# Patient Record
Sex: Female | Born: 2000
Health system: Southern US, Community
[De-identification: ages and names within clinical notes are randomized; demographics above are authoritative.]

## PROBLEM LIST (undated history)

## (undated) DIAGNOSIS — K8020 Calculus of gallbladder without cholecystitis without obstruction: Secondary | ICD-10-CM

## (undated) HISTORY — PX: CHOLECYSTECTOMY: SHX55

## (undated) HISTORY — DX: Calculus of gallbladder without cholecystitis without obstruction: K80.20

---
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
## 2018-01-14 ENCOUNTER — Encounter: Payer: Self-pay | Admitting: Family Medicine

## 2018-01-14 ENCOUNTER — Ambulatory Visit (INDEPENDENT_AMBULATORY_CARE_PROVIDER_SITE_OTHER): Payer: Managed Care, Other (non HMO) | Admitting: Family Medicine

## 2018-01-14 VITALS — BP 118/64 | HR 64 | Temp 98.4°F | Resp 16 | Ht 68.0 in | Wt 204.8 lb

## 2018-01-14 DIAGNOSIS — L03811 Cellulitis of head [any part, except face]: Secondary | ICD-10-CM

## 2018-01-14 MED ORDER — DOXYCYCLINE HYCLATE 100 MG PO CAPS: 100.0000 mg | ORAL_CAPSULE | Freq: Two times a day (BID) | ORAL | 0 refills | Status: DC

## 2018-01-14 MED FILL — DOXYCYCLINE HYCLATE 100 MG: 100 | 10 days supply | Qty: 20 | Fill #0

## 2018-01-14 NOTE — Patient Instructions (Signed)
We are going to treat you for the infection on your ear with doxycycline- take twice a day for 10 days Warm compresses may help if there is any more drainage  If this area is a recurrent issue you may want to have it removed by dermatology, but hopefully this will not be necessary Please let me know if not back to baseline in 3-4 days-Sooner if worse.

## 2018-01-14 NOTE — Progress Notes (Signed)
[facility] [address], [address] [place], [place] [address] [phone]  Date:  01/14/2018   Name:  Sherry Dominguez   DOB:  Oct 04, 2000   MRN:  [id number]  PCP:  Donato Schultz, DO    Chief Complaint: Infection on Ear (keloid scar, left ear, redness, pain to touch, discharge)   History of Present Illness:  Sherry Dominguez is a 17 y.o. very pleasant female patient who presents with the following:  Pt of Dr. Laury Axon here today with concern of a problem with her left ear She developed a keloid in her left pinna after a piercing in 2013.  It developed a keloid about 2 years after the piercing was placed, but otherwise did not hurt or cause any other issues.  She left the piercing in until a couple of months ago when she happened to remove it   Then about a week ago the keloid became tender, warm, and drained some pus and blood a couple of times No fever or chills No retained part of a piercing in keloild  She is otherwise feeling well Generally in good health   Patient Active Problem List   Diagnosis Date Noted  . Sinusitis 10/22/2011  . Serous otitis media 07/08/2011  . Viral pharyngitis 06/05/2011  . HYPERTRIGLYCERIDEMIA 02/19/2010  . EPISTAXIS 07/31/2009  . UNSPECIFIED ANEMIA 02/13/2009  . INGROWN TOENAIL 02/13/2009  . OTITIS EXTERNA, ACUTE, BILATERAL 10/06/2008  . GERD 10/06/2008  . PARONYCHIA, LEFT GREAT TOE 07/27/2008  . VIRAL URI 03/08/2008    No past medical history on file.  Past Surgical History:  Procedure Laterality Date  . CHOLECYSTECTOMY      Social History   Tobacco Use  . Smoking status: Never Smoker  . Smokeless tobacco: Never Used  Substance Use Topics  . Alcohol use: No  . Drug use: No    Family History  Problem Relation Age of Onset  . Hypertension Father   . Hyperlipidemia Father     Allergies  Allergen Reactions  . Sulfa Antibiotics Hives  . Clindamycin/Lincomycin Nausea  And Vomiting  . Amoxicillin Nausea And Vomiting  . Bactrim [Sulfamethoxazole-Trimethoprim]   . Penicillins Nausea And Vomiting  . Red Dye Nausea And Vomiting    Medication list has been reviewed and updated.  Current Outpatient Medications on File Prior to Visit  Medication Sig Dispense Refill  . MONONESSA 0.25-35 MG-MCG tablet Take 1 tablet by mouth daily.     No current facility-administered medications on file prior to visit.     Review of Systems:  As per HPI- otherwise negative. Pt states no chance of pregnancy at this time    Physical Examination: Vitals:   01/14/18 1323  BP: 118/64  Pulse: 64  Resp: 16  Temp: 98.4 F (36.9 C)  SpO2: 99%   Vitals:   01/14/18 1323  Weight: 204 lb 12.8 oz (92.9 kg)  Height: 5\' 8"  (1.727 m)   Body mass index is 31.14 kg/m. Ideal Body Weight: Weight in (lb) to have BMI = 25: 164.1  GEN: WDWN, NAD, Non-toxic, A & O x 3, overweight, looks well  HEENT: Atraumatic, Normocephalic. Neck supple. No masses, No LAD. Bilateral TM wnl, oropharynx normal.  PEERL,EOMI.   Left ear displays a blueberry sized keloid over the posterior pinna at the site of a former "barbell" type piercing The keloid is slightly erythematous and tender.  Cannot express any discharge or pus at this time.  Ears and Nose: No external deformity. CV: RRR, No M/G/R. No JVD. No thrill. No extra heart sounds. PULM: CTA B, no wheezes, crackles, rhonchi. No retractions. No resp. distress. No accessory muscle use. EXTR: No c/c/e NEURO Normal gait.  PSYCH: Normally interactive. Conversant. Not depressed or anxious appearing.  Calm demeanor.    Assessment and Plan: Cellulitis of head except face - Plan: doxycycline (VIBRAMYCIN) 100 MG capsule  Infected keloid on left ear Treat with doxycycline due to several drug allergies.  Cautioned that this med could decrease efficacy of her OCP   We are going to treat you for the infection on your ear with doxycycline- take twice  a day for 10 days Warm compresses may help if there is any more drainage  If this area is a recurrent issue you may want to have it removed by dermatology, but hopefully this will not be necessary Please let me know if not back to baseline in 3-4 days-Sooner if worse  Signed Abbe Amsterdam, MD

## 2018-08-13 DIAGNOSIS — Z01419 Encounter for gynecological examination (general) (routine) without abnormal findings: Secondary | ICD-10-CM | POA: Diagnosis not present

## 2018-08-13 DIAGNOSIS — Z6830 Body mass index (BMI) 30.0-30.9, adult: Secondary | ICD-10-CM | POA: Diagnosis not present

## 2019-01-30 ENCOUNTER — Encounter: Payer: Self-pay | Admitting: Emergency Medicine

## 2019-01-30 ENCOUNTER — Other Ambulatory Visit: Payer: Self-pay

## 2019-01-30 ENCOUNTER — Emergency Department (INDEPENDENT_AMBULATORY_CARE_PROVIDER_SITE_OTHER)
Admission: EM | Admit: 2019-01-30 | Discharge: 2019-01-30 | Disposition: A | Discharge status: Home / Self Care | Payer: Managed Care, Other (non HMO) | Source: Home / Self Care | Attending: Family Medicine | Admitting: Family Medicine

## 2019-01-30 DIAGNOSIS — J069 Acute upper respiratory infection, unspecified: Secondary | ICD-10-CM

## 2019-01-30 DIAGNOSIS — U071 COVID-19: Secondary | ICD-10-CM

## 2019-01-30 LAB — POC SARS CORONAVIRUS 2 AG -  ED: SARS Coronavirus 2 Ag: POSITIVE — AB

## 2019-01-30 NOTE — Discharge Instructions (Addendum)
Take plain guaifenesin (1227m extended release tabs such as Mucinex) twice daily, with plenty of water, for cough and congestion.  May add Pseudoephedrine (39m one or two every 4 to 6 hours) for sinus congestion.  Get adequate rest.   May use Afrin nasal spray (or generic oxymetazoline) each morning for about 5 days and then discontinue.  Also recommend using saline nasal spray several times daily and saline nasal irrigation (AYR is a common brand).  Use Flonase nasal spray each morning after using Afrin nasal spray and saline nasal irrigation. Try warm salt water gargles for sore throat.  Stop all antihistamines for now, and other non-prescription cough/cold preparations. May take Ibuprofen 20011m4 tabs every 8 hours with food for body aches, headache, etc. May take Delsym Cough Suppressant at bedtime for nighttime cough.    Isolate yourself until the below conditions are met: 1)  At least 7 days since symptoms onset. AND 2)  > 72 hours after symptom resolution (absence of fever without the use of fever-reducing medicine, and improvement in respiratory symptoms.

## 2019-01-30 NOTE — ED Provider Notes (Signed)
[name]    CSN: [id number] Arrival date & time: 01/30/19  1206      History   Chief Complaint Chief Complaint  Patient presents with  . Nasal Congestion  . Cough  . Ear Problem    HPI Sherry Dominguez is a 18 y.o. female.   Three days ago patient developed a mild sore throat, and yesterday developed mild cough, fatigue, and headache.  Her appetite has been decreased and today she has developed sweats.  She notes that she has a decreased sensation of taste.  She denies chest tightness or pleuritic pain.  The history is provided by the patient.    History reviewed. No pertinent past medical history.  Patient Active Problem List   Diagnosis Date Noted  . Sinusitis 10/22/2011  . Serous otitis media 07/08/2011  . Viral pharyngitis 06/05/2011  . HYPERTRIGLYCERIDEMIA 02/19/2010  . EPISTAXIS 07/31/2009  . UNSPECIFIED ANEMIA 02/13/2009  . INGROWN TOENAIL 02/13/2009  . OTITIS EXTERNA, ACUTE, BILATERAL 10/06/2008  . GERD 10/06/2008  . PARONYCHIA, LEFT GREAT TOE 07/27/2008  . VIRAL URI 03/08/2008    Past Surgical History:  Procedure Laterality Date  . CHOLECYSTECTOMY      OB History   No obstetric history on file.      Home Medications    Prior to Admission medications   Medication Sig Start Date End Date Taking? Authorizing Provider  norgestimate-ethinyl estradiol (ESTARYLLA) 0.25-35 MG-MCG tablet Take 1 tablet by mouth daily.   Yes [provider]  doxycycline (VIBRAMYCIN) 100 MG capsule Take 1 capsule (100 mg total) by mouth 2 (two) times daily. 01/14/18   Copland, Gay Filler, MD  MONONESSA 0.25-35 MG-MCG tablet Take 1 tablet by mouth daily. 04/29/15   [provider]    Family History Family History  Problem Relation Age of Onset  . Hypertension Father   . Hyperlipidemia Father     Social History Social History   Tobacco Use  . Smoking status: Former Research scientist (life sciences)  . Smokeless tobacco: Never Used  Substance Use Topics  .  Alcohol use: No  . Drug use: No     Allergies   Sulfa antibiotics, Clindamycin/lincomycin, Amoxicillin, Bactrim [sulfamethoxazole-trimethoprim], Penicillins, and Red dye   Review of Systems Review of Systems + sore throat + cough No pleuritic pain No wheezing No nasal congestion No post-nasal drainage No sinus pain/pressure No itchy/red eyes ? earache No hemoptysis No SOB No fever, + sweats No nausea No vomiting No abdominal pain No diarrhea No urinary symptoms No skin rash + fatigue No myalgias + headache    Physical Exam Triage Vital Signs ED Triage Vitals  Enc Vitals Group     BP 01/30/19 1353 120/79     Pulse Rate 01/30/19 1353 100     Resp 01/30/19 1353 16     Temp 01/30/19 1353 98.5 F (36.9 C)     Temp Source 01/30/19 1353 Oral     SpO2 01/30/19 1353 99 %     Weight 01/30/19 1355 194 lb (88 kg)     Height 01/30/19 1355 5' 8"  (1.727 m)     Head Circumference --      Peak Flow --      Pain Score 01/30/19 1354 0     Pain Loc --      Pain Edu? --      Excl. in [place]? --    No data found.  Updated Vital Signs BP 120/79 (BP Location: Right Arm)   Pulse 100  Temp 98.5 F (36.9 C) (Oral)   Resp 16   Ht 5' 8"  (1.727 m)   Wt 88 kg   LMP 01/18/2019 (Exact Date)   SpO2 99%   BMI 29.50 kg/m   Visual Acuity Right Eye Distance:   Left Eye Distance:   Bilateral Distance:    Right Eye Near:   Left Eye Near:    Bilateral Near:     Physical Exam Nursing notes and Vital Signs reviewed. Appearance:  Patient appears stated age, and in no acute distress Eyes:  Pupils are equal, round, and reactive to light and accomodation.  Extraocular movement is intact.  Conjunctivae are not inflamed  Ears:  Canals normal.  Tympanic membranes normal.  Nose:  Mildly congested turbinates.  No sinus tenderness.   Pharynx:  Normal Neck:  Supple.  Lateal nodes mildly enlarged, nontender Lungs:  Clear to auscultation.  Breath sounds are equal.  Moving air well. Heart:   Regular rate and rhythm without murmurs, rubs, or gallops.  Abdomen:  Nontender without masses or hepatosplenomegaly.  Bowel sounds are present.  No CVA or flank tenderness.  Extremities:  No edema.  Skin:  No rash present.    UC Treatments / Results  Labs (all labs ordered are listed, but only abnormal results are displayed) Labs Reviewed  POC SARS CORONAVIRUS 2 AG -  ED - Abnormal; Notable for the following components:      Result Value   SARS Coronavirus 2 Ag Positive (*)    All other components within normal limits    EKG   Radiology No results found.  Procedures Procedures (including critical care time)  Medications Ordered in UC Medications - No data to display  Initial Impression / Assessment and Plan / UC Course  I have reviewed the triage vital signs and the nursing notes.  Pertinent labs & imaging results that were available during my care of the patient were reviewed by me and considered in my medical decision making (see chart for details).    Treat symptomatically for now.  If symptoms become significantly worse during the night or over the weekend, proceed to the local emergency room.    Final Clinical Impressions(s) / UC Diagnoses   Final diagnoses:  Viral URI with cough  COVID-19 virus infection     Discharge Instructions     Take plain guaifenesin (1241m extended release tabs such as Mucinex) twice daily, with plenty of water, for cough and congestion.  May add Pseudoephedrine (334m one or two every 4 to 6 hours) for sinus congestion.  Get adequate rest.   May use Afrin nasal spray (or generic oxymetazoline) each morning for about 5 days and then discontinue.  Also recommend using saline nasal spray several times daily and saline nasal irrigation (AYR is a common brand).  Use Flonase nasal spray each morning after using Afrin nasal spray and saline nasal irrigation. Try warm salt water gargles for sore throat.  Stop all antihistamines for now, and  other non-prescription cough/cold preparations. May take Ibuprofen 20058m4 tabs every 8 hours with food for body aches, headache, etc. May take Delsym Cough Suppressant at bedtime for nighttime cough.    Isolate yourself until the below conditions are met: 1)  At least 7 days since symptoms onset. AND 2)  > 72 hours after symptom resolution (absence of fever without the use of fever-reducing medicine, and improvement in respiratory symptoms.        ED Prescriptions    None  Kandra Nicolas, MD 02/01/19 1251

## 2019-01-30 NOTE — ED Triage Notes (Signed)
Patient here for 2 day history of mild cough, ear discomfort/pressure; denies fever or other symptoms.  She has not had influenza vacc this season. She has not travelled or had contact with known covid positive person.

## 2019-03-26 ENCOUNTER — Other Ambulatory Visit: Payer: Self-pay

## 2019-03-26 ENCOUNTER — Encounter: Payer: Self-pay | Admitting: Family Medicine

## 2019-03-26 ENCOUNTER — Ambulatory Visit: Payer: BC Managed Care – PPO | Admitting: Family Medicine

## 2019-03-26 VITALS — BP 120/82 | Temp 97.5°F | Ht 68.0 in | Wt 203.0 lb

## 2019-03-26 DIAGNOSIS — H6591 Unspecified nonsuppurative otitis media, right ear: Secondary | ICD-10-CM | POA: Diagnosis not present

## 2019-03-26 DIAGNOSIS — H6983 Other specified disorders of Eustachian tube, bilateral: Secondary | ICD-10-CM

## 2019-03-26 MED ORDER — PREDNISONE 20 MG PO TABS: 40.0000 mg | ORAL_TABLET | Freq: Every day | ORAL | 0 refills | Status: AC

## 2019-03-26 MED ORDER — PREDNISONE 20 MG PO TABS: 40.0000 mg | ORAL_TABLET | Freq: Every day | ORAL | 0 refills | Status: DC

## 2019-03-26 NOTE — Patient Instructions (Signed)
Use Flonase in the future if this happens again.  If you start having increasing pain, worsening symptoms, fevers, or drainage from the ear, seek care.  Let us know if you need anything.

## 2019-03-26 NOTE — Progress Notes (Signed)
Chief Complaint  Patient presents with  . Ear Pain    both    Pt is here for bilateral ear pain. R is worse.  Duration: 2 weeks Progression: unchanged Associated symptoms: Fullness Denies: congestion, coryza and sneezing, bleeding, or discharge from ear Treatment to date: None  ROS:  HEENT: +ear pain Costitutional: Denies fevers  Family History  Problem Relation Age of Onset  . Hypertension Father   . Hyperlipidemia Father    BP 120/82 (BP Location: Left Arm, Patient Position: Sitting, Cuff Size: Normal)   Temp (!) 97.5 F (36.4 C) (Temporal)   Ht 5\' 8"  (1.727 m)   Wt 203 lb (92.1 kg)   BMI 30.87 kg/m  General: Awake, alert, appearing stated age HEENT:  L ear- Canal patent without drainage or erythema, TM is retracted w/o fluid or erythema R ear- canal patent without drainage or erythema, TM is mildly bulging w serous fluid, no erythema Nose- nares patent and without discharge Mouth- Lips, gums and dentition unremarkable, pharynx is without erythema or exudate Neck: No adenopathy Lungs: Normal effort, no accessory muscle use Psych: Age appropriate judgment and insight, normal mood and affect  Dysfunction of both eustachian tubes - Plan: predniSONE (DELTASONE) 20 MG tablet  Fluid level behind tympanic membrane of right ear - Plan: predniSONE (DELTASONE) 20 MG tablet  Pred burst. INCS if it happens in the future.  F/u in 2 weeks if symptoms fail to improve, sooner if needed. Pt voiced understanding and agreement to the plan.  [employer], DO 03/26/19 4:01 PM

## 2019-11-06 ENCOUNTER — Emergency Department (HOSPITAL_BASED_OUTPATIENT_CLINIC_OR_DEPARTMENT_OTHER)
Admission: EM | Admit: 2019-11-06 | Discharge: 2019-11-06 | Disposition: A | Discharge status: Home / Self Care | Payer: Managed Care, Other (non HMO) | Attending: Emergency Medicine | Admitting: Emergency Medicine

## 2019-11-06 ENCOUNTER — Other Ambulatory Visit: Payer: Self-pay

## 2019-11-06 ENCOUNTER — Encounter (HOSPITAL_BASED_OUTPATIENT_CLINIC_OR_DEPARTMENT_OTHER): Payer: Self-pay | Admitting: Emergency Medicine

## 2019-11-06 DIAGNOSIS — Y990 Civilian activity done for income or pay: Secondary | ICD-10-CM | POA: Insufficient documentation

## 2019-11-06 DIAGNOSIS — S060X0A Concussion without loss of consciousness, initial encounter: Secondary | ICD-10-CM | POA: Insufficient documentation

## 2019-11-06 DIAGNOSIS — Z87891 Personal history of nicotine dependence: Secondary | ICD-10-CM | POA: Insufficient documentation

## 2019-11-06 DIAGNOSIS — Y9389 Activity, other specified: Secondary | ICD-10-CM | POA: Diagnosis not present

## 2019-11-06 DIAGNOSIS — S0990XA Unspecified injury of head, initial encounter: Secondary | ICD-10-CM | POA: Insufficient documentation

## 2019-11-06 DIAGNOSIS — W208XXA Other cause of strike by thrown, projected or falling object, initial encounter: Secondary | ICD-10-CM | POA: Insufficient documentation

## 2019-11-06 DIAGNOSIS — Y92511 Restaurant or cafe as the place of occurrence of the external cause: Secondary | ICD-10-CM | POA: Insufficient documentation

## 2019-11-06 MED ORDER — ONDANSETRON 4 MG PO TBDP: 4.0000 mg | ORAL_TABLET | Freq: Three times a day (TID) | ORAL | 0 refills | Status: DC | PRN

## 2019-11-06 NOTE — Discharge Instructions (Signed)
You were evaluated in the Emergency Department and after careful evaluation, we did not find any emergent condition requiring admission or further testing in the hospital.  Your exam/testing today was overall reassuring.  Your symptoms seem to be due to a mild concussion.  As discussed, we recommend mental and physical rest for the next 2 days with slow return to normal daily activities.  You can use the Zofran medication as needed for nausea.  We recommend Tylenol or Motrin at home for discomfort.  Please return to the Emergency Department if you experience any worsening of your condition.  Thank you for allowing [place] to be a part of your care.

## 2019-11-06 NOTE — ED Triage Notes (Addendum)
Several boxes of frozen chicken fell on her head yesterday and knocked her over at work. C/o headache. Denies LOC

## 2019-11-06 NOTE — ED Provider Notes (Signed)
[facility] Emergency Department Provider Note MRN:  [id number]  Arrival date & time: 11/06/19     Chief Complaint   Head Injury   History of Present Illness   Sherry Dominguez is a 19 y.o. year-old female with no pertinent past medical history presenting to the ED with chief complaint of head injury.  Patient explains that she works at [employer] and was in the freezer and a box of frozen chicken fell off the rack onto her head.  Denies loss of consciousness, continued on with her shift yesterday.  Today has been experiencing some mild nausea, dull frontal headache, some lightheadedness when standing.  Here for evaluation.  Symptoms are mild to moderate, constant, no other exacerbating relieving factors.  Denies any numbness or weakness to the arms or legs, no fever, no neck pain, no other complaints.  Review of Systems  A complete 10 system review of systems was obtained and all systems are negative except as noted in the HPI and PMH.   Patient's Health History   History reviewed. No pertinent past medical history.  Past Surgical History:  Procedure Laterality Date  . CHOLECYSTECTOMY      Family History  Problem Relation Age of Onset  . Hypertension Father   . Hyperlipidemia Father     Social History   Socioeconomic History  . Marital status: Single    Spouse name: Not on file  . Number of children: Not on file  . Years of education: Not on file  . Highest education level: Not on file  Occupational History  . Not on file  Tobacco Use  . Smoking status: Former Games developer  . Smokeless tobacco: Never Used  Substance and Sexual Activity  . Alcohol use: No  . Drug use: No  . Sexual activity: Not on file  Other Topics Concern  . Not on file  Social History Narrative  . Not on file   Social Determinants of Health   Financial Resource Strain:   . Difficulty of Paying Living Expenses: Not on file  Food Insecurity:   . Worried About Community education officer in the Last Year: Not on file  . Ran Out of Food in the Last Year: Not on file  Transportation Needs:   . Lack of Transportation (Medical): Not on file  . Lack of Transportation (Non-Medical): Not on file  Physical Activity:   . Days of Exercise per Week: Not on file  . Minutes of Exercise per Session: Not on file  Stress:   . Feeling of Stress : Not on file  Social Connections:   . Frequency of Communication with Friends and Family: Not on file  . Frequency of Social Gatherings with Friends and Family: Not on file  . Attends Religious Services: Not on file  . Active Member of Clubs or Organizations: Not on file  . Attends Banker Meetings: Not on file  . Marital Status: Not on file  Intimate Partner Violence:   . Fear of Current or Ex-Partner: Not on file  . Emotionally Abused: Not on file  . Physically Abused: Not on file  . Sexually Abused: Not on file     Physical Exam   Vitals:   11/06/19 1902  BP: 128/75  Pulse: 72  Resp: 16  Temp: 98.2 F (36.8 C)  SpO2: 100%    CONSTITUTIONAL: Well-appearing, NAD NEURO:  Alert and oriented x 3, normal and symmetric strength and sensation, normal coordination, normal speech  EYES:  eyes equal and reactive ENT/NECK:  no LAD, no JVD CARDIO: Regular rate, well-perfused, normal S1 and S2 PULM:  CTAB no wheezing or rhonchi GI/GU:  normal bowel sounds, non-distended, non-tender MSK/SPINE:  No gross deformities, no edema SKIN:  no rash, atraumatic PSYCH:  Appropriate speech and behavior  *Additional and/or pertinent findings included in MDM below  Diagnostic and Interventional Summary    EKG Interpretation  Date/Time:    Ventricular Rate:    PR Interval:    QRS Duration:   QT Interval:    QTC Calculation:   R Axis:     Text Interpretation:        Labs Reviewed - No data to display  No orders to display    Medications - No data to display   Procedures  /  Critical Care Procedures  ED Course  and Medical Decision Making  I have reviewed the triage vital signs, the nursing notes, and pertinent available records from the EMR.  Listed above are laboratory and imaging tests that I personally ordered, reviewed, and interpreted and then considered in my medical decision making (see below for details).  Reassuring neurological exam, highly doubt intracranial bleeding or other emergent process.  Symptoms seem consistent with mild concussion.  Normal range of motion of the neck with no midline tenderness, no indication for any other imaging, appropriate for reassurance and mental/physical rest at home.       Elmer Sow. Pilar Plate, MD [facility] Emergency Medicine [facility] [email or web address]  Final Clinical Impressions(s) / ED Diagnoses     ICD-10-CM   1. Injury of head, initial encounter  S09.90XA   2. Concussion without loss of consciousness, initial encounter  S06.0X0A     ED Discharge Orders         Ordered    ondansetron (ZOFRAN ODT) 4 MG disintegrating tablet  Every 8 hours PRN        11/06/19 2143           Discharge Instructions Discussed with and Provided to Patient:     Discharge Instructions     You were evaluated in the Emergency Department and after careful evaluation, we did not find any emergent condition requiring admission or further testing in the hospital.  Your exam/testing today was overall reassuring.  Your symptoms seem to be due to a mild concussion.  As discussed, we recommend mental and physical rest for the next 2 days with slow return to normal daily activities.  You can use the Zofran medication as needed for nausea.  We recommend Tylenol or Motrin at home for discomfort.  Please return to the Emergency Department if you experience any worsening of your condition.  Thank you for allowing [place] to be a part of your care.       Sabas Sous, MD 11/06/19 2146

## 2019-11-08 ENCOUNTER — Ambulatory Visit (INDEPENDENT_AMBULATORY_CARE_PROVIDER_SITE_OTHER): Payer: Managed Care, Other (non HMO) | Admitting: Family

## 2019-11-08 ENCOUNTER — Encounter: Payer: Self-pay | Admitting: Family

## 2019-11-08 ENCOUNTER — Other Ambulatory Visit: Payer: Self-pay

## 2019-11-08 VITALS — BP 130/70 | HR 77 | Temp 98.6°F | Resp 16 | Ht 68.0 in | Wt 195.0 lb

## 2019-11-08 DIAGNOSIS — S060X0A Concussion without loss of consciousness, initial encounter: Secondary | ICD-10-CM | POA: Diagnosis not present

## 2019-11-08 NOTE — Patient Instructions (Signed)
You should be contacted about scheduling your appointment with the concussion clinic.  Concussion, Adult  A concussion is a brain injury from a hard, direct hit (trauma) to the head or body. This direct hit causes the brain to shake quickly back and forth inside the skull. This can damage brain cells and cause chemical changes in the brain. A concussion may also be known as a mild traumatic brain injury (TBI). Concussions are usually not life-threatening, but the effects of a concussion can be serious. If you have a concussion, you should be very careful to avoid having a second concussion. What are the causes? This condition is caused by:  A direct hit to your head, such as: ? Running into another player during a game. ? Being hit in a fight. ? Hitting your head on a hard surface.  Sudden movement of your body that causes your brain to move back and forth inside the skull, such as in a car crash. What are the signs or symptoms? The signs of a concussion can be hard to notice. Early on, they may be missed by you, family members, and health care providers. You may look fine on the outside but may act or feel differently. Symptoms are usually temporary and most often improve in 7-10 days. Some symptoms appear right away, but other symptoms may not show up for hours or days. If your symptoms last longer than normal, you may have post-concussion syndrome. Every head injury is different. Physical symptoms  Headaches. This can include a feeling of pressure in the head or migraine-like symptoms.  Tiredness (fatigue).  Dizziness.  Problems with coordination or balance.  Vision or hearing problems.  Sensitivity to light or noise.  Nausea or vomiting.  Changes in eating or sleeping patterns.  Numbness or tingling.  Seizure. Mental and emotional symptoms  Memory problems.  Trouble concentrating, organizing, or making decisions.  Slowness in thinking, acting or reacting, speaking, or  reading.  Irritability or mood changes.  Anxiety or depression. How is this diagnosed? This condition is diagnosed based on:  Your symptoms.  A description of your injury. You may also have tests, including:  Imaging tests, such as a CT scan or MRI.  Neuropsychological tests. These measure your thinking, understanding, learning, and remembering abilities. How is this treated? Treatment for this condition includes:  Stopping sports or activity if you are injured. If you hit your head or show signs of concussion: ? Do not return to sports or activities the same day. ? Get checked by a health care provider before you return to your activities.  Physical and mental rest and careful observation, usually at home. Gradually return to your normal activities.  Medicines to help with symptoms such as headaches, nausea, or difficulty sleeping. ? Avoid taking opioid pain medicine while recovering from a concussion.  Avoiding alcohol and drugs. These may slow your recovery and can put you at risk of further injury.  Referral to a concussion clinic or rehabilitation center. Recovery from a concussion can take time. How fast you recover depends on many factors. Return to activities only when:  Your symptoms are completely gone.  Your health care provider says that it is safe. Follow these instructions at home: Activity  Limit activities that require a lot of thought or concentration, such as: ? Doing homework or job-related work. ? Watching TV. ? Working on the computer or phone. ? Playing memory games and puzzles.  Rest. Rest helps your brain heal. Make sure you: ?  Get plenty of sleep. Most adults should get 7-9 hours of sleep each night. ? Rest during the day. Take naps or rest breaks when you feel tired.  Avoid physical activity like exercise until your health care provider says it is safe. Stop any activity that worsens symptoms.  Do not do high-risk activities that could  cause a second concussion, such as riding a bike or playing sports.  Ask your health care provider when you can return to your normal activities, such as school, work, athletics, and driving. Your ability to react may be slower after a brain injury. Never do these activities if you are dizzy. Your health care provider will likely give you a plan for gradually returning to activities. General instructions   Take over-the-counter and prescription medicines only as told by your health care provider. Some medicines, such as blood thinners (anticoagulants) and aspirin, may increase the risk for complications, such as bleeding.  Do not drink alcohol until your health care provider says you can.  Watch your symptoms and tell others around you to do the same. Complications sometimes occur after a concussion. Older adults with a brain injury may have a higher risk of serious complications.  Tell your work Production designer, theatre/television/film, teachers, Tax adviser, school counselor, coach, or Event organiser about your injury, symptoms, and restrictions.  Keep all follow-up visits as told by your health care provider. This is important. How is this prevented? Avoiding another brain injury is very important. In rare cases, another injury can lead to permanent brain damage, brain swelling, or death. The risk of this is greatest during the first 7-10 days after a head injury. Avoid injuries by:  Stopping activities that could lead to a second concussion, such as contact or recreational sports, until your health care provider says it is okay.  Taking these actions once you have returned to sports or activities: ? Avoiding plays or moves that can cause you to crash into another person. This is how most concussions occur. ? Following the rules and being respectful of other players. Do not engage in violent or illegal plays.  Getting regular exercise that includes strength and balance training.  Wearing a properly fitting helmet  during sports, biking, or other activities. Helmets can help protect you from serious skull and brain injuries, but they do not protect you from a concussion. Even when wearing a helmet, you should avoid being hit in the head. Contact a health care provider if:  Your symptoms get worse or they do not improve.  You have new symptoms.  You have another injury. Get help right away if:  You have severe or worsening headaches.  You have weakness or numbness in any part of your body.  You are confused.  Your coordination gets worse.  You vomit repeatedly.  You are sleepier than normal.  Your speech is slurred.  You cannot recognize people or places.  You have a seizure.  It is difficult to wake you up.  You have unusual behavior changes.  You have changes in your vision.  You lose consciousness. Summary  A concussion is a brain injury that results from a hard, direct hit (trauma) to your head or body.  You may have imaging tests and neuropsychological tests to diagnose a concussion.  Treatment for this condition includes physical and mental rest and careful observation.  Ask your health care provider when you can return to your normal activities, such as school, work, athletics, and driving.  Get help right away  if you have a severe headache, weakness on one side of the body, seizures, behavior changes, changes in vision, or if you are confused or sleepier than normal. This information is not intended to replace advice given to you by your health care provider. Make sure you discuss any questions you have with your health care provider. Document Revised: 10/16/2017 Document Reviewed: 10/16/2017 Elsevier Patient Education  2020 [employer].

## 2019-11-08 NOTE — Progress Notes (Addendum)
Subjective:    Patient ID: Sherry Dominguez, female    DOB: 09-Sep-2000, 19 y.o.   MRN: [id number]  HPI  Patient is a 19 yr old female who presents today following a head injury which occurred at work on 8/27. She reports that a box of frozen chicken fell on her head from while in the walk in freezer at her work. She reports that she was moving stacks of 40 pound frozen chicken and a box fell from high pushing her head into the door injuring her left jaw.  This occurred around 3 PM on 11/06/19.  She states that she took 5 minutes to compose herself and then finished the remaining 4 hours of her shift.  At 5PM that day she developed a HA.  The following day she returned to work around 2 PM.  Around 5:30 PM she reports that she developed some confusion. She was trying to read off words on the monitor at work but she states that the right words would not come out. She also had a brief episode of blurred vision.  Due to these symptoms she presented to the ED on 11/06/19. ED record is reviewed.   Since that time she has been having HA/sensitivity to light, some nausea.  She has not vomitted. She is eating and drinking.  HA is intermittent 5/10.  Tylenol and motrin does help her pain.    Review of Systems See HPI  No past medical history on file.   Social History   Socioeconomic History  . Marital status: Single    Spouse name: Not on file  . Number of children: Not on file  . Years of education: Not on file  . Highest education level: Not on file  Occupational History  . Not on file  Tobacco Use  . Smoking status: Former Games developer  . Smokeless tobacco: Never Used  Substance and Sexual Activity  . Alcohol use: No  . Drug use: No  . Sexual activity: Not on file  Other Topics Concern  . Not on file  Social History Narrative  . Not on file   Social Determinants of Health   Financial Resource Strain:   . Difficulty of Paying Living Expenses: Not on file  Food Insecurity:   . Worried  About Programme researcher, broadcasting/film/video in the Last Year: Not on file  . Ran Out of Food in the Last Year: Not on file  Transportation Needs:   . Lack of Transportation (Medical): Not on file  . Lack of Transportation (Non-Medical): Not on file  Physical Activity:   . Days of Exercise per Week: Not on file  . Minutes of Exercise per Session: Not on file  Stress:   . Feeling of Stress : Not on file  Social Connections:   . Frequency of Communication with Friends and Family: Not on file  . Frequency of Social Gatherings with Friends and Family: Not on file  . Attends Religious Services: Not on file  . Active Member of Clubs or Organizations: Not on file  . Attends Banker Meetings: Not on file  . Marital Status: Not on file  Intimate Partner Violence:   . Fear of Current or Ex-Partner: Not on file  . Emotionally Abused: Not on file  . Physically Abused: Not on file  . Sexually Abused: Not on file    Past Surgical History:  Procedure Laterality Date  . CHOLECYSTECTOMY      Family History  Problem Relation Age  of Onset  . Hypertension Father   . Hyperlipidemia Father     Allergies  Allergen Reactions  . Sulfa Antibiotics Hives  . Clindamycin/Lincomycin Nausea And Vomiting  . Amoxicillin Nausea And Vomiting  . Bactrim [Sulfamethoxazole-Trimethoprim]   . Penicillins Nausea And Vomiting  . Red Dye Nausea And Vomiting    Current Outpatient Medications on File Prior to Visit  Medication Sig Dispense Refill  . norgestimate-ethinyl estradiol (ESTARYLLA) 0.25-35 MG-MCG tablet Take 1 tablet by mouth daily.    . ondansetron (ZOFRAN ODT) 4 MG disintegrating tablet Take 1 tablet (4 mg total) by mouth every 8 (eight) hours as needed for nausea or vomiting. 20 tablet 0   No current facility-administered medications on file prior to visit.    BP 130/70 (BP Location: Right Arm, Patient Position: Sitting, Cuff Size: Small)   Pulse 77   Temp 98.6 F (37 C) (Oral)   Resp 16   Ht 5'  8" (1.727 m)   Wt 195 lb (88.5 kg)   LMP 10/12/2019   SpO2 100%   BMI 29.65 kg/m       Objective:   Physical Exam Constitutional:      Appearance: She is well-developed.  HENT:     Head: Normocephalic and atraumatic.     Comments: Some tenderness overlying left lateral jaw Eyes:     Extraocular Movements:     Right eye: Normal extraocular motion.     Pupils: Pupils are equal, round, and reactive to light.  Cardiovascular:     Rate and Rhythm: Normal rate and regular rhythm.     Heart sounds: Normal heart sounds. No murmur heard.   Pulmonary:     Effort: Pulmonary effort is normal. No respiratory distress.     Breath sounds: Normal breath sounds. No wheezing.  Neurological:     Mental Status: She is alert and oriented to person, place, and time.     Cranial Nerves: Facial asymmetry present. No cranial nerve deficit or dysarthria.     Motor: No weakness.     Deep Tendon Reflexes: Reflexes normal.  Psychiatric:        Behavior: Behavior normal.        Thought Content: Thought content normal.        Judgment: Judgment normal.           Assessment & Plan:  Post concussive syndrome- reassuring neuro exam.  Will refer to concussion clinic.    20 minutes spent on today's visit. Time was spent reviewing chart, interviewing/examining patient and forming medical plan.   This visit occurred during the SARS-CoV-2 public health emergency.  Safety protocols were in place, including screening questions prior to the visit, additional usage of staff PPE, and extensive cleaning of exam room while observing appropriate contact time as indicated for disinfecting solutions.

## 2019-11-09 NOTE — Addendum Note (Signed)
Addended by: Sandford Craze on: 11/09/2019 09:46 AM   Modules accepted: Level of Service

## 2019-11-10 ENCOUNTER — Telehealth: Payer: Self-pay | Admitting: Family Medicine

## 2019-11-10 NOTE — Telephone Encounter (Signed)
Called pt and she will need to call back to schedule a new pt concussion visit.  Please schedule in any appropriate 30 min slot between 8am-3:30pm.

## 2019-11-10 NOTE — Telephone Encounter (Signed)
Referred for concussion from Sandford Craze, for your review.

## 2020-03-30 ENCOUNTER — Ambulatory Visit: Payer: Managed Care, Other (non HMO) | Admitting: Family Medicine

## 2020-04-17 ENCOUNTER — Ambulatory Visit: Payer: BC Managed Care – PPO | Admitting: Family Medicine

## 2020-04-17 ENCOUNTER — Encounter: Payer: Self-pay | Admitting: Family Medicine

## 2020-04-17 ENCOUNTER — Other Ambulatory Visit: Payer: Self-pay

## 2020-04-17 VITALS — BP 120/60 | HR 90 | Temp 98.5°F | Resp 18 | Ht 68.0 in | Wt 216.4 lb

## 2020-04-17 DIAGNOSIS — G44301 Post-traumatic headache, unspecified, intractable: Secondary | ICD-10-CM | POA: Diagnosis not present

## 2020-04-17 DIAGNOSIS — S0990XA Unspecified injury of head, initial encounter: Secondary | ICD-10-CM | POA: Diagnosis not present

## 2020-04-17 DIAGNOSIS — R413 Other amnesia: Secondary | ICD-10-CM | POA: Diagnosis not present

## 2020-04-17 LAB — CBC WITH DIFFERENTIAL/PLATELET
Basophils Absolute: 0 10*3/uL (ref 0.0–0.1)
Basophils Relative: 0.4 % (ref 0.0–3.0)
Eosinophils Absolute: 0 10*3/uL (ref 0.0–0.7)
Eosinophils Relative: 0.6 % (ref 0.0–5.0)
HCT: 36.9 % (ref 36.0–46.0)
Hemoglobin: 12.3 g/dL (ref 12.0–15.0)
Lymphocytes Relative: 36.1 % (ref 12.0–46.0)
Lymphs Abs: 2 10*3/uL (ref 0.7–4.0)
MCHC: 33.4 g/dL (ref 30.0–36.0)
MCV: 88.5 fl (ref 78.0–100.0)
Monocytes Absolute: 0.4 10*3/uL (ref 0.1–1.0)
Monocytes Relative: 8 % (ref 3.0–12.0)
Neutro Abs: 3 10*3/uL (ref 1.4–7.7)
Neutrophils Relative %: 54.9 % (ref 43.0–77.0)
Platelets: 257 10*3/uL (ref 150.0–400.0)
RBC: 4.17 Mil/uL (ref 3.87–5.11)
RDW: 12.9 % (ref 11.5–15.5)
WBC: 5.6 10*3/uL (ref 4.0–10.5)

## 2020-04-17 LAB — COMPREHENSIVE METABOLIC PANEL
ALT: 10 U/L (ref 0–35)
AST: 12 U/L (ref 0–37)
Albumin: 4.2 g/dL (ref 3.5–5.2)
Alkaline Phosphatase: 49 U/L (ref 39–117)
BUN: 13 mg/dL (ref 6–23)
CO2: 27 mEq/L (ref 19–32)
Calcium: 9.8 mg/dL (ref 8.4–10.5)
Chloride: 104 mEq/L (ref 96–112)
Creatinine, Ser: 0.76 mg/dL (ref 0.40–1.20)
GFR: 112.04 mL/min (ref >60.00)
Glucose, Bld: 76 mg/dL (ref 70–99)
Potassium: 4.6 mEq/L (ref 3.5–5.1)
Sodium: 136 mEq/L (ref 135–145)
Total Bilirubin: 0.4 mg/dL (ref 0.2–1.2)
Total Protein: 7.1 g/dL (ref 6.0–8.3)

## 2020-04-17 LAB — SEDIMENTATION RATE: Sed Rate: 11 mm/hr (ref 0–20)

## 2020-04-17 NOTE — Assessment & Plan Note (Signed)
Probably post concussive Will refer to neuro  Check labs

## 2020-04-17 NOTE — Patient Instructions (Signed)
[employer] [facility] (pp. 2876-2897). [place], [place]. Elsevier."> Neurosurgery, 80(1), 6-15. Retrieved on August 30, 2018.[email or web address]"> Primary Care (5th ed., pp. 218-221). [place], [place]: Elsevier."> Rosen's Emergency Medicine: Concepts and Clinical Practice (9th ed., pp. 301-329). [place], [place]: Elsevier."> Neurosurgery, 75 Suppl 1, S3-15. Retrieved on August 30, 2018.[email or web address]">  Head Injury, Adult There are many types of head injuries. Head injuries can be as minor as a small bump, or they can be a serious medical issue. More severe head injuries include:  A jarring injury to the brain (concussion).  A bruise (contusion) of the brain. This means there is bleeding in the brain that can cause swelling.  A cracked skull (skull fracture).  Bleeding in the brain that collects, clots, and forms a bump (hematoma). After a head injury, most problems occur within the first 24 hours, but side effects may occur up to 7-10 days after the injury. It is important to watch your condition for any changes. You may need to be observed in the emergency department or urgent care, or you may be admitted to the hospital. What are the causes? There are many possible causes of a head injury. Serious head injuries may be caused by car accidents, bicycle or motorcycle accidents, sports injuries, falls, or being struck by an object. What are the symptoms? Symptoms of a head injury include a contusion, bump, or bleeding at the site of the injury. Other physical symptoms may include:  Headache.  Nausea or vomiting.  Dizziness.  Blurred or double vision.  Being uncomfortable around bright lights or loud noises.  Seizures.  Feeling tired.  Trouble being awakened.  Loss of consciousness. Mental or emotional symptoms may include:  Irritability.  Confusion and memory problems.  Poor attention and concentration.  Changes  in eating or sleeping habits.  Anxiety or depression. How is this diagnosed? This condition can usually be diagnosed based on your symptoms, a description of the injury, and a physical exam. You may also have imaging tests done, such as a CT scan or an MRI. How is this treated? Treatment for this condition depends on the severity and type of injury you have. The main goal of treatment is to prevent complications and allow the brain time to heal. Mild head injury If you have a mild head injury, you may be sent home, and treatment may include:  Observation. A responsible adult should stay with you for 24 hours after your injury and check on you often.  Physical rest.  Brain rest.  Pain medicines. Severe head injury If you have a severe head injury, treatment may include:  Close observation. This includes hospitalization with the following care: ? Frequent physical exams. ? Frequent checks of how your brain and nervous system are working (neurological status). ? Checking your blood pressure and oxygen levels.  Medicines to relieve pain, prevent seizures, and decrease brain swelling.  Airway protection and breathing support. This may include using a ventilator.  Treatments that monitor and manage swelling inside the brain.  Brain surgery. This may be needed to: ? Remove a collection of blood or blood clots. ? Stop the bleeding. ? Remove a part of the skull to allow room for the brain to swell. Follow these instructions at home: Activity  Rest and avoid activities that are physically hard or tiring.  Make sure you get enough sleep.  Let your brain rest by limiting activities that require a lot of thought or attention, such as: ? Watching TV. ? Playing memory games   and puzzles. ? Job-related work or homework. ? Working on the computer, using social media, and texting.  Avoid activities that could cause another head injury, such as playing sports, until your health care  provider approves. Having another head injury, especially before the first one has healed, can be dangerous.  Ask your health care provider when it is safe for you to return to your regular activities, including work or school. Ask your health care provider for a step-by-step plan for gradually returning to activities.  Ask your health care provider when you can drive, ride a bicycle, or use heavy machinery. Your ability to react may be slower after a brain injury. Do not do these activities if you are dizzy. Lifestyle  Do not drink alcohol until your health care provider approves. Do not use drugs. Alcohol and certain drugs may slow your recovery and can put you at risk of further injury.  If it is harder than usual to remember things, write them down.  If you are easily distracted, try to do one thing at a time.  Talk with family members or close friends when making important decisions.  Tell your friends, family, a trusted colleague, and work manager about your injury, symptoms, and restrictions. Have them watch for any new or worsening problems.   General instructions  Take over-the-counter and prescription medicines only as told by your health care provider.  Have someone stay with you for 24 hours after your head injury. This person should watch you for any changes in your symptoms and be ready to seek medical help.  Keep all follow-up visits as told by your health care provider. This is important. How is this prevented?  Work on improving your balance and strength to avoid falls.  Wear a seat belt when you are in a moving vehicle.  Wear a helmet when riding a bicycle, skiing, or doing any other sport or activity that has a risk of injury.  If you drink alcohol: ? Limit how much you use to:  0-1 drink a day for nonpregnant women.  0-2 drinks a day for men. ? Be aware of how much alcohol is in your drink. In the [place] one drink equals one 12 oz bottle of beer (355 mL), one 5  oz glass of wine (148 mL), or one 1 oz glass of hard liquor (44 mL).  Take safety measures in your home, such as: ? Removing clutter and tripping hazards from floors and stairways. ? Using grab bars in bathrooms and handrails by stairs. ? Placing non-slip mats on floors and in bathtubs. ? Improving lighting in dim areas. Where to find more information  Centers for Disease Control and Prevention: www.cdc.gov Get help right away if:  You have: ? A severe headache that is not helped by medicine. ? Trouble walking or weakness in your arms and legs. ? Clear or bloody fluid coming from your nose or ears. ? Changes in your vision. ? A seizure. ? Increased confusion or irritability.  Your symptoms get worse.  You are sleepier than normal and have trouble staying awake.  You lose your balance.  Your pupils change size.  Your speech is slurred.  Your dizziness gets worse.  You vomit. These symptoms may represent a serious problem that is an emergency. Do not wait to see if the symptoms will go away. Get medical help right away. Call your local emergency services (911 in the [place] Do not drive yourself to the hospital. Summary  Head   injuries can be minor, or they can be a serious medical issue requiring immediate attention.  Treatment for this condition depends on the severity and type of injury you have.  Have someone stay with you for 24 hours after your injury and check on you often.  Ask your health care provider when it is safe for you to return to your regular activities, including work or school.  Head injury prevention includes wearing a seat belt in a motor vehicle, using a helmet on a bicycle, limiting alcohol use, and taking safety measures in your home. This information is not intended to replace advice given to you by your health care provider. Make sure you discuss any questions you have with your health care provider. Document Revised: 01/08/2019 Document Reviewed:  01/08/2019 Elsevier Patient Education  2021 [employer]  

## 2020-04-17 NOTE — Progress Notes (Signed)
Patient ID: Sherry Dominguez, female    DOB: [birth year]  Age: 20 y.o. MRN: [id number]    Subjective:  Subjective  HPI [name] presents for f/u concussion in September --- she went to er --- frozen chickens fell on her and she hit her head on a metal wall.  She worked for several more hours   She started getting headaches that are severe and last all day sometimes.   Tylenol rarely helps no vision changes ,  No headache today but she usually gets them several days a weeks -- they will come on as they day goes on and sometimes las 1-2 days.   + photo/ phono phonbia  + nausea   Review of Systems  Constitutional: Negative for appetite change, diaphoresis, fatigue and unexpected weight change.  Eyes: Negative for pain, redness and visual disturbance.  Respiratory: Negative for cough, chest tightness, shortness of breath and wheezing.   Cardiovascular: Negative for chest pain, palpitations and leg swelling.  Gastrointestinal: Positive for nausea. Negative for vomiting.  Endocrine: Negative for cold intolerance, heat intolerance, polydipsia, polyphagia and polyuria.  Genitourinary: Negative for difficulty urinating, dysuria and frequency.  Neurological: Positive for headaches. Negative for dizziness, light-headedness and numbness.  Psychiatric/Behavioral: Positive for confusion and decreased concentration. Negative for self-injury, sleep disturbance and suicidal ideas. The patient is not nervous/anxious and is not hyperactive.     History No past medical history on file.  She has a past surgical history that includes Cholecystectomy.   Her family history includes Hyperlipidemia in her father; Hypertension in her father.She reports that she has quit smoking. She has never used smokeless tobacco. She reports that she does not drink alcohol and does not use drugs.  Current Outpatient Medications on File Prior to Visit  Medication Sig Dispense Refill  . norgestimate-ethinyl estradiol  (ORTHO-CYCLEN) 0.25-35 MG-MCG tablet Take 1 tablet by mouth daily.    . ondansetron (ZOFRAN ODT) 4 MG disintegrating tablet Take 1 tablet (4 mg total) by mouth every 8 (eight) hours as needed for nausea or vomiting. 20 tablet 0   No current facility-administered medications on file prior to visit.     Objective:  Objective  Physical Exam Vitals and nursing note reviewed.  Constitutional:      Appearance: She is well-developed and well-nourished.  HENT:     Head: Normocephalic and atraumatic.  Eyes:     General: No visual field deficit.    Extraocular Movements: EOM normal.     Conjunctiva/sclera: Conjunctivae normal.  Neck:     Thyroid: No thyromegaly.     Vascular: No carotid bruit or JVD.  Cardiovascular:     Rate and Rhythm: Normal rate and regular rhythm.     Heart sounds: Normal heart sounds. No murmur heard.   Pulmonary:     Effort: Pulmonary effort is normal. No respiratory distress.     Breath sounds: Normal breath sounds. No wheezing or rales.  Chest:     Chest wall: No tenderness.  Musculoskeletal:        General: No edema.     Cervical back: Normal range of motion and neck supple.  Neurological:     Mental Status: She is alert.     Sensory: No sensory deficit.     Motor: No weakness.     Coordination: Coordination normal.     Gait: Gait normal.     Comments: mmse 27/30  Psychiatric:        Mood and Affect: Mood and affect and mood  normal.        Behavior: Behavior normal.    BP 120/60 (BP Location: Right Arm, Patient Position: Sitting, Cuff Size: Normal)   Pulse 90   Temp 98.5 F (36.9 C) (Oral)   Resp 18   Ht 5\' 8"  (1.727 m)   Wt 216 lb 6.4 oz (98.2 kg)   SpO2 98%   BMI 32.90 kg/m  Wt Readings from Last 3 Encounters:  04/17/20 216 lb 6.4 oz (98.2 kg)  11/08/19 195 lb (88.5 kg)  11/06/19 192 lb (87.1 kg)     Lab Results  Component Value Date   WBC 7.8 07/03/2015   HGB 13.4 07/03/2015   HCT 39.8 07/03/2015   PLT 351.0 07/03/2015   GLUCOSE  88 07/24/2015   CHOL 176 02/13/2010   TRIG 258.0 (H) 02/13/2010   HDL 32.60 (L) 02/13/2010   LDLDIRECT 112.0 02/13/2010   ALT 9 07/24/2015   AST 11 07/24/2015   NA 137 07/24/2015   K 4.0 07/24/2015   CL 104 07/24/2015   CREATININE 0.72 07/24/2015   BUN 12 07/24/2015   CO2 27 07/24/2015    No results found.   Assessment & Plan:  Plan  I am having Vanetta Rommel maintain her norgestimate-ethinyl estradiol and ondansetron.  No orders of the defined types were placed in this encounter.   Problem List Items Addressed This Visit      Unprioritized   Head trauma - Primary    Check MRI brain Refer to neuro       Relevant Orders   MR Brain Wo Contrast   CBC with Differential/Platelet   Comprehensive metabolic panel   Sedimentation rate   Ambulatory referral to Neurology   Intractable post-traumatic headache    Take tylenol at first sign headache If no relief rto or go to er  Check mri brain       Memory loss    Probably post concussive Will refer to neuro  Check labs       Relevant Orders   Ambulatory referral to Neurology      Follow-up: Return if symptoms worsen or fail to improve.  07/26/2015, DO

## 2020-04-17 NOTE — Assessment & Plan Note (Signed)
Check MRI brain Refer to neuro

## 2020-04-17 NOTE — Assessment & Plan Note (Signed)
Take tylenol at first sign headache If no relief rto or go to er  Check mri brain

## 2020-04-24 ENCOUNTER — Ambulatory Visit (HOSPITAL_COMMUNITY)
Admission: RE | Admit: 2020-04-24 | Discharge: 2020-04-24 | Disposition: A | Discharge status: Home / Self Care | Payer: No Typology Code available for payment source | Source: Ambulatory Visit | Attending: Family Medicine | Admitting: Family Medicine

## 2020-04-24 DIAGNOSIS — S0990XA Unspecified injury of head, initial encounter: Secondary | ICD-10-CM | POA: Insufficient documentation

## 2020-05-02 ENCOUNTER — Encounter: Payer: Self-pay | Admitting: Counselor

## 2020-06-15 ENCOUNTER — Encounter: Payer: Self-pay | Admitting: Counselor

## 2020-06-15 ENCOUNTER — Ambulatory Visit (INDEPENDENT_AMBULATORY_CARE_PROVIDER_SITE_OTHER): Payer: BC Managed Care – PPO | Admitting: Counselor

## 2020-06-15 ENCOUNTER — Other Ambulatory Visit: Payer: Self-pay

## 2020-06-15 ENCOUNTER — Ambulatory Visit: Payer: BC Managed Care – PPO

## 2020-06-15 DIAGNOSIS — F0781 Postconcussional syndrome: Secondary | ICD-10-CM

## 2020-06-15 DIAGNOSIS — F329 Major depressive disorder, single episode, unspecified: Secondary | ICD-10-CM

## 2020-06-15 DIAGNOSIS — R454 Irritability and anger: Secondary | ICD-10-CM | POA: Diagnosis not present

## 2020-06-15 DIAGNOSIS — F32A Depression, unspecified: Secondary | ICD-10-CM

## 2020-06-15 DIAGNOSIS — F09 Unspecified mental disorder due to known physiological condition: Secondary | ICD-10-CM

## 2020-06-15 NOTE — Progress Notes (Signed)
   Psychometrist Note   Cognitive testing was administered to [employer] by Lamar Benes, B.S. (Technician) under the supervision of Alphonzo Severance, Psy.D., ABN. [name] was able to tolerate all test procedures. Dr. Nicole Kindred met with the patient as needed to manage any emotional reactions to the testing procedures. Rest breaks were offered.    The battery of tests administered was selected by Dr. Nicole Kindred with consideration to the patient's current level of functioning, the nature of her symptoms, emotional and behavioral responses during the interview, level of literacy, observed level of motivation/effort, and the nature of the referral question. This battery was communicated to the psychometrist. Communication between Dr. Nicole Kindred and the psychometrist was ongoing throughout the evaluation and Dr. Nicole Kindred was immediately accessible at all times. Dr. Nicole Kindred provided supervision to the technician on the date of this service, to the extent necessary to assure the quality of all services provided.    Ms. Sconyers will return in approximately one week for an interactive feedback session with Dr. Nicole Kindred, at which time test performance, clinical impressions, and treatment recommendations will be reviewed in detail. The patient understands she can contact our office should she require our assistance before this time.   A total of 140 minutes of billable time were spent with Enid Skeens by the technician, including test administration and scoring time. Billing for these services is reflected in Dr. Les Pou note.   This note reflects time spent with the psychometrician and does not include test scores, clinical history, or any interpretations made by Dr. Nicole Kindred. The full report will follow in a separate note.

## 2020-06-15 NOTE — Progress Notes (Signed)
NEUROPSYCHOLOGICAL TEST SCORES [facility]  Patient Name: Sherry Dominguez MRN: [id number] Date of Birth: Jan 13, 2001 Age: 20 y.o. Education: 13 years  Measurement properties of test scores: IQ, Index, and Standard Scores (SS): Mean = 100; Standard Deviation = 15 Scaled Scores (Ss): Mean = 10; Standard Deviation = 3 Z scores (Z): Mean = 0; Standard Deviation = 1 T scores (T); Mean = 50; Standard Deviation = 10  TEST SCORES:    Note: This summary of test scores accompanies the interpretive report and should not be interpreted by unqualified individuals or in isolation without reference to the report. Test scores are relative to age, gender, and educational history as available and appropriate.   Performance Validity        ACS: Raw  Descriptor      Word Choice 44 Below Expectation       Raw  Descriptor  The Dot Counting Test: 6 Within Expectation      Embedded Measures: Raw  Descriptor      NAB Effort Index 0 Within Expectation      Expected Functioning        Wide Range Achievement Test: Standard/Scaled Score Percentile      Word Reading 99 47      Reynolds Intellectual Screening Test Standard/T-score Percentile      Guess What 46 34      Odd Item Out 50 50  RIST Index 97 42      Attention/Processing Speed        Neuropsychological Assessment Battery (Attention Module, Form 1): Scaled/T-score Percentile  Attention Index (ATT): 110 75      Digits Forward 43 25      Digits Backwards 54 66      Dots 49 46      Numbers & Letters A Speed 65 93      Numbers & Letters A Accuracy 31 3      Numbers & Letters A Efficiency 65 93      Numbers & Letters B Efficiency 65 93      Numbers & Letters C Efficiency 55 69      Numbers & Letters D Disruption 69 97      Numbers & Letters D Efficiency 69 97      Driving Scenes 33 5      Language        Neuropsychological Assessment Battery (Language Module, Form 1): T-score Percentile      Naming   (31) 59 82      Verbal  Fluency:  T Score Percentile      Controlled Oral Word Association (F-A-S) 36 8      Semantic Fluency (Animals) 43 25      Memory:        Neuropsychological Assessment Battery (Memory Module, Form 1): T-score/Standard Score Percentile  Memory Index (MEM): 96 39      List Learning           List A Immediate Recall   (6 , 10 , 12) 48 42         List B Immediate Recall   (5) 44 27         List A Short Delayed Recall   (9) 46 34         List A Long Delayed Recall   (11) 60 84         List A Percent Retention   (122 %) --- 88  List A Long Delayed Yes/No Recognition Hits   (12) --- 75         List A Long Delayed Yes/No Recognition False Alarms   (1) --- 58         List A Recognition Discriminability Index --- 66      Shape Learning           Immediate Recognition   (6 , 4 , 7) 41 18         Delayed Recognition   (9) 57 75         Percent Retention   (129 %) --- 88         Delayed Forced-Choice Recognition Hits   (7) --- 25         Delayed Forced-Choice Recognition False Alarms   (1) --- <1         Delayed Forced-Choice Recognition Discriminability --- 9      Story Learning           Immediate Recall   (28, 34) 42 21         Delayed Recall   (32) 41 18         Percent Retention   (94 %) --- 46      Daily Living Memory            Immediate Recall   (24, 23) 50 50          Delayed Recall   (9, 8) 52 58          Percent Retention (100 %) --- 69          Recognition Hits   (10) --- 73      Visuospatial/Constructional Functioning        Neuropsychological Assessment Battery (Visuospatial Module) T-score Percentile      Visual Discrimination 40 16      Design Construction 70 98      Executive Functioning        Modified Wisconsin Card Sorting Test (MWCST): Standard/T-Score Percentile      Number of Categories Correct 54 66      Number of Perseverative Errors 56 73      Number of Total Errors 45 31      Percent Perseverative Errors 56 73  Executive Function Composite 108 70       Trail Making Test: T-Score Percentile      Part A 69 97      Part B 80 >99      Rating Scales         Raw Score Descriptor  Beck Depression Inventory - II 38 Severe  Beck Anxiety Inventory 13 Mild   Austine Wiedeman V. Roseanne Reno PsyD, ABN Clinical Neuropsychologist

## 2020-06-15 NOTE — Progress Notes (Signed)
[facility]  Dominguez Name: Sherry Dominguez MRN: [id number] Date of Birth: 2000/08/01 Age: 20 y.o. Education: 61 years  Referral Circumstances and Background Information  Sherry Dominguez is a 20 y.o., right-hand dominant, single woman with a history of head injury referred by her PCP Dr. Carollee Herter for neuropsychological evaluation. Since Sherry incident on 11/05/2019, she has had recurrent intractable posttraumatic headache.   As per available records, Sherry Dominguez was in her normal state of health on 11/05/2019 and was completing a shift at worked when she was struck by a 40lb box of frozen chicken falling off a shelf, pushing her head into Sherry door and injuring her left jaw. She did not lose consciousness and it sounds as though she has detailed recollection of Sherry events immediately preceding and following Sherry incident. She took several minutes to compose herself and was able to finish her shift but did develop a headache after about 2 hours. Sherry following day she felt as though "Sherry right words would not come out" when she was reading Sherry monitor at work and she also had a brief episode of blurred vision, prompting her to go to Sherry ED. She was evaluated without being admitted or imaged based on her symptoms, with impression of concussion. She has subsequently developed headaches several days per week that come and go and sometimes last 1-2 days associated with photoboia, phonophobia, and nausea.   Dominguez mother reported working a night shift and so she wasn't particularly involved with Sherry Dominguez until November and noticed changes in that time.   On interview, Sherry Dominguez reported that she noticed difficulties with memory and thinking starting around December, 2021 nearly 4 months after her head injury. It sounds as though these difficulties were also noticed by her mother, who has experience with head injuries by virtue of a family member and was quite  concerned. She initially didn't make much of it but eventually, in January, 2022, she decided to get her difficulties checked out, resulting in her visit with Dr. Carollee Herter, Sherry MRI, and Sherry current referral. In terms of day-to-day symptoms, she feels like she will forget details of conversations, sometimes entire conversations, and she will also have a hard time recalling everything that she needs to do at work. She feels like these issues come and go to some extent but are "more frequent than not." Her mother Sherry Dominguez is here today and has noticed those difficulties, she will tell her things and Sherry Dominguez will swear that she has never told her. She will also occasionally lose her train of thought. On specific review of symptoms, Sherry Dominguez reported that she was having problems with word finding and putting words together after Sherry incident but not so much anymore. She acknowledged feeling slower than in Sherry past, as though she has difficulties keeping up with things, which she notices mainly at work. She acknowledged problems with attention and concentration, such as when in a conversation, although she feels like she has always been that way. She thinks this is because she is always doing multiple things at once. She has no difficulties with judgment and problem solving or decision making.   With respect to mood, Sherry Dominguez reported that she "stresses out" about things more than she should lately. She feels irritable, more than she should be, and that has been going on for a while and has been noticeable to her mother. She endorses a subjective sense of sadness and not getting as  much pleasure out of life as she would like, which has been going on years. Apart from her mother, she stated that his has probably been an issue since late elementary school. Things were difficult at home, her mother was in "survival mode," and she had to do a lot of self--parenting. She is not sleeping well, she wakes up at 3:30am  every night and can't go back to bed. Sometimes, she is thinking about things but other times, she just gets up. Her energy is variable although she is quite active, she apparently works 60 hours a week, is taking courses towards her associate's, and she is in Sherry gym 4 days a week. She would like to be a Airline pilot and wants to get stronger so she is involved in power lifting. It sounds like her appetite is variable, she has a hard time having consistent meals due to her schedule and at times, she isn't hungry. She usually will eat 2 meals a day, but sometimes she cannot eat related to her work schedule and odd hours.   Sherry Dominguez became tearful when I asked if she has problems day-to-day as a result of her memory and thinking problems. Apparently, her manager is quite hard on her, and will get on her case and be "fairly aggressive" if she forgets to do anything. 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 They have also lost many people over Sherry past several weeks and are short staffed, which makes it hard. She denied that she is having any difficulties functioning at school as a result of these problems, her grades have not gone down, and she has a cumulative GPA around 3.8.  Sherry Dominguez reported that she lives on her own and she is independent with respect to all things such as managing money, making appointments, medications, and Sherry like. She does write a lot of notes.   Past Medical History and Review of Relevant Studies   Dominguez Active Problem List   Diagnosis Date Noted  . Head trauma 04/17/2020  . Memory loss 04/17/2020  . Intractable post-traumatic headache 04/17/2020  . Sinusitis 10/22/2011  . Serous otitis media 07/08/2011  . Viral pharyngitis 06/05/2011  . HYPERTRIGLYCERIDEMIA 02/19/2010  . EPISTAXIS 07/31/2009  . UNSPECIFIED ANEMIA 02/13/2009  . INGROWN TOENAIL 02/13/2009  .  OTITIS EXTERNA, ACUTE, BILATERAL 10/06/2008  . GERD 10/06/2008  . PARONYCHIA, LEFT GREAT TOE 07/27/2008  . VIRAL URI 03/08/2008    Review of Neuroimaging and Relevant Medical History: Recent MRI brain from 04/25/2020 was reviewed by me and is normal without any areas of concerning volume loss, leukoaraiosis, or blooming artifacts on GRE. Reassuring MRI of Sherry brain.   Sherry Dominguez denied any other pertinent neurological history such as seizures, strokes, or other head injuries.   Current Outpatient Medications  Medication Sig Dispense Refill  . norgestimate-ethinyl estradiol (ORTHO-CYCLEN) 0.25-35 MG-MCG tablet Take 1 tablet by mouth daily.    . ondansetron (ZOFRAN ODT) 4 MG disintegrating tablet Take 1 tablet (4 mg total) by mouth every 8 (eight) hours as needed for nausea or vomiting. 20 tablet 0   No current facility-administered medications for this visit.   Family History  Problem Relation Age of Onset  . Hypertension Father   . Hyperlipidemia Father    There is a family history of dementia. Sherry Dominguez's great grandmother developed Sherry condition later in life. There is no  family history of psychiatric illness. Sherry Dominguez has a half-brother with ADHD (father's side).   Psychosocial History  Developmental, Educational and Employment History: Sherry Dominguez had a normal childhood development and on time attainment of milestones. It sounds as though her father was difficult to be around and unstable. Her parents divorced when she was 69, which was hard on her. Her older brother thus took on a lot of Sherry parenting responsibilities and was not Sherry best parental figure as per Sherry Dominguez. Nevertheless, there was no frank abuse or neglect. 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 She said she let it slip because she was "done with high school." She was able to to graduate a semester early. There is no  history of concerns about ADHD, behavior problems, or Sherry like. Her mother had experience with ADHD from a son in law and stated that she "never saw that" in Sherry Dominguez. She reported that she never thought she wanted to go to college. At Sherry time, she wanted to join Sherry [employer], although she was not eligible related to a knee condition. She eventually decided that she wanted to be a IT trainer and is currently en route to that goal. She is currently working on a degree in Nurse, adult services at [employer].   Psychiatric History: Sherry Dominguez reported that she did some counseling her Freshman year of high school for "a couple months." This was when her father got remarried. She didn't find it particularly helpful and stopped. She has no psychiatric history. She has a history of "minor self injury" in middle school.   Substance Use History: Sherry Dominguez doesn't use any illicit substances, she doesn't drink regularly, she does vape most days. She had quit and restarted in February.   Relationship History and Living Cimcumstances: Sherry Dominguez was in a long-term relationship that ended in February, 2022. She had met this individual online although did meet him several times. It sounds like that was stressful.   Mental Status and Behavioral Observations  Sensorium/Arousal: Sherry Dominguez's level of arousal was awake and alert. Hearing and vision were adequate for testing purposes. Orientation: Sherry Dominguez was alert and oriented to person, place, time, and situation.  Appearance: Dressed in appropriate, casual clothing with good grooming and hygiene.  Behavior: Pleasant, appropriate, quite tearful during interview. As per technician, Dominguez was well engaged, and in fact said that she enjoyed testing.  Speech/language: Sherry Dominguez's speech was normal in rate, rhythm, volume, and prosody without word finding difficulties or paraphasic errors.  Gait/Posture: Normal on observation of ambulation within Sherry  clinic Movement: No signs of movement problems in this young Dominguez Social Comportment: Appropriate within social norms Mood: Depressed Affect: Mood congruent with frequent tearfulness Thought process/content: Sherry Dominguez's thought process was logical, linear, and goal directed. She was able to readily provide a clinical history and detailed personal timeline.  Safety: Sherry Dominguez does have some passive thoughts of dying but she has no intent of carrying them out. These are ongoing and have been an issue for some time.  Insight: Fair  Test Procedures  [facility] Achievement Test - 4             Word Reading [employer] Intellectual Screening Test Neuropsychological Assessment Battery             Attention Module  Memory Module              Publishing copy Sherry Dot Counting Test ACS Word Choice Controlled Oral Word Association (F-A-S) Conservation officer, historic buildings (Animals) Trail Making Test A & B Modified Wisconsin Card Sorting Test [place] Depression Inventory - II Beck Anxiety Inventory  Plan  Sherry Dominguez was seen for a psychiatric diagnostic evaluation and neuropsychological testing. She is a pleasant, 20 year old, right-hand dominant woman with a history of mild head injury resulting in a possible concussion on 11/05/2019. She did not lose consciousness but did have posttraumatic headache and other symptoms. Several months later, Sherry Dominguez and her mother because concerned about cognitive changes and then eventually in January, 2022 they decided to pursue further workup. On interview, she presents as very reasonable although she does appear to have some affective issues and was frequently tearful. She also has headaches that are recurrent. She is functioning at a very high level, enrolled in full time courses and working 60 hours a week. Full and complete note with impressions, recommendations, and  interpretation of test data to follow.   Sherry Dominguez Sherry Kindred, PsyD, [place] Clinical Neuropsychologist  Informed Consent  Risks and benefits of Sherry evaluation were discussed with Sherry Dominguez prior to all testing procedures. I conducted a clinical interview   with Sherry Dominguez and Lamar Benes, B.S. (Technician) administered additional test procedures. Sherry Dominguez was able to tolerate Sherry testing procedures and Sherry Dominguez (and/or family if applicable) is likely to benefit from further follow up to receive Sherry diagnosis and treatment recommendations, which will be rendered at Sherry next encounter.

## 2020-06-19 NOTE — Progress Notes (Signed)
NEUROPSYCHOLOGICAL EVALUATION [facility]  Patient Name: Sherry Dominguez MRN: [id number] Date of Birth: Jul 07, 2000 Age: 20 y.o. Education: 13 years  Clinical Impressions  Sherry Dominguez is a 21 y.o., right-hand dominant, single woman with a history of mild head injury on August 27th, 2021 when she was struck by a 40lb box of frozen chicken as she was rearranging things in the freezer at work. She did not lose consciousness and she has detailed recollection for the events surrounding the incident although she did develop a headache and has recurrent posttraumatic headaches since then. She and her mother noticed memory and thinking problems about 3-4 months later and decided to get them checked out. The patient mostly notices her symptoms at work, and coworkers have commented that her memory is "not like it was."  She also forgets details of things, can forget entire conversations, she feels slower than in the past. She does acknowledge that she multitasks and is "always doing something else" when studying, having a conversation, etc. 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   Findings from neuropsychological testing are reassuring and demonstrate entirely normal performance in all areas. Specifically, Sherry Dominguez performed at an average level on measures of attention and memory (in aggregate) and there were no patterns of low scores concerning for cognitive impairment. She did report severe levels of depressive symptoms and was quite tearful at times. She reported moderate levels of anxiety symptoms.   Sherry Dominguez is thus demonstrating good cognitive performance following what sounds like a mild head injury. My sense is that her day-to-day problems are more likely on the basis of affective distress and she likely has a frank major depressive disorder. She is also managing an  incredibly demanding schedule (working 60 hours a week while taking full time classes and hitting the gym 4x a week), which would be objectively challenging for anyone. Sleep difficulties may also be contributory. She will be reassured regarding her cognition and directed to appropriate treatment, which could include medication at the referring provider's discretion.   Diagnostic Impressions: Depressive disorder Insomnia  Recommendations to be discussed with patient  Your performance and presentation on neuropsychological assessment were entirely normal. That is to say, you performed within the expected range for you in all domains assessed. I would like to highlight that you actually did quite well, with superior scores on several challenging measures of processing speed (which is one of the main deficits we expect to find in individuals with head injury). You also had very good, near errorless delayed word list recall that is better than 84% of age and education matched peers. This is all very reassuring regarding your objective level of cognitive performance. Nevertheless, you are having difficulties day-to-day and I think there are several potential contributing factors, although they are not likely due to your head injury.   When an individual strikes their head and experiences an alteration of consciousness or a brief loss of consciousness without complications like a brain bleed, this is called a concussion or "mild traumatic brain injury." This is not a brain injury per se in terms of causing structural damage to the brain, but it does negatively impact the brain's functioning in terms of energy metabolism (i.e, it is a metabolic injury). Most people experience cognitive symptoms following a concussion, and these typically resolve within hours to days. The vast majority of individuals  recover completely in a matter of time, without any measurable lasting cognitive impairments. A small minority of  people may experience persisting symptoms, the so-called "postconcussion syndrome," and there is debate about why this is the case. Oftentimes, the symptoms that people report are things that happen with a relatively high frequency in the general population (e.g., things like irritability, difficulties concentrating, and headaches). These things are also associated with depression and anxiety, which are two of the biggest risk factors risk factors for postconcussion syndrome. Things like changes in routines, pain related to headaches, difficulties readjusting to work and normal activities, financial stress, depression, and anxiety about full recovery may be more important precipitants of postconcussive symptoms than the brain injury itself. My best recommendation is to resume normal levels of activity as tolerated after you are medically cleared to do so and not to overly focus on cognitive problems, which will only make them worse by distracting you and detracting attention from the task at hand.    It is also important to know that cognitive difficulties due to a head injury do not worsen over time, as a rule, unless this is over the course of hours to days immediately after the injury related to bleeding or other serious problems. The fact that you noticed your problems months after your injury is further reassuring that the problems you are having are not due to a brain injury.   You reported a severe level of depressive symptoms. Depression can affect cognitive functioning in several ways. For one, there are neurobiological changes in depression that can contribute to attention, concentration, and memory problems. These changes are so common they are part of the criteria we use to diagnose depressive disorders. Depression can also negatively impact your own appraisal of your cognitive abilities, leading you to feel like you are performing more poorly than is objectively warranted.  The treatments of  choice for depression are medication and/or psychotherapy. There is some evidence that patients who choose their preferred method of treatment do better than those who are assigned to treatment arbitrarily, so I would encourage you to feel empowered to get better in the way that makes most sense to you.   Excessive life stress is a risk factor for cognitive difficulties, sleep problems, depression, and other stress-related health outcomes. Your schedule is objectively very challenging and would be a lot for anyone to manage. I would encourage you to consider if there are any things that you may wish to rearrange or scale back. Sometimes, problems sleeping, depression, and memory concerns are a sign that you are taking on too many things. I would also like to highlight that this is a personal decision, at the end of the day, and is also something you could discuss with a psychotherapist in terms of finding balance in your life.   Avoid overfocusing on cognitive performance. Memory and cognition are notoriously fallible and if you are looking for cognitive problems, you are bound to find them. Once someone gets worried about their memory and thinking, they may overfocus on how they are doing day-to-day, and then when normal day-to-day cognitive errors are made, this becomes a cause for more concern. This concern and anxiety then decreases focus from the task at hand, reducing concentration, causing more cognitive problems, and creating a vicious cycle. Rather than critiquing your performance, I would encourage you to remain present minded and focus on the task at hand. Perhaps most importantly, have reasonable expectations for yourself.  Healthy people forget things,  lose focus, and do not perform 100% correctly all the time. Some cognitive errors are normal and are not necessarily a sign that there is something wrong with your brain.   There are few things as disruptive to brain functioning as not getting a  good night's sleep. For sleep, I recommend against using medications, which can have lingering sedating effects on the brain and rob your brain of restful REM sleep. Instead, consider trying some of the following sleep hygiene recommendations. They may not work at once and may take effort, but the effort you spend is likely to be rewarded with better sleep eventually:  . Stick to a sleep schedule of the same bedtime and wake time even on the weekends, which can help to regulate your body's internal clock so that you fall asleep and stay asleep.  . Practice a relaxing bedtime ritual (conducted away from bright lights) which will help separate your sleep from stimulating activities and prepare your body to fall asleep when you go to bed.  . Avoid naps, especially in the afternoon.  . Evaluate your room and create conditions that will promote sleep such as keeping it cool (between 60 - 67 degrees), quiet, and free from any lights. Consider using blackout curtains, a "white noise" generator, or fan that will help mask any noises that might prevent you from going to sleep or awaken you during the night.  . Sleep on a comfortable mattress and pillows.  . Avoid bright light in the evening and excessive use of portable electronic devices right before bed that may contain light frequencies that can contribute to sleep problems.  . Avoid alcohol, cigarettes, or heavy meals in the evening. If you must eat, consume a light snack 45 minutes before bed.  . Use your bed only for sleep to strengthen the association between your bed and sleep.  . If you can't go to sleep within 30 minutes, go into another room and do something relaxing until you feel tired. Then, come back and try to go to sleep again for 30 minutes and repeat until sleep is achieved.  . Some people find over the counter melatonin to be helpful for sleep, which you could discuss with a pharmacist or prescribing provider.   You are already exercising,  which is great. Establishing the habit of exercise now will hold you in good stead as you age, it is one of the most powerful preventative medicines that we have and improves outcomes in a huge number of different health outcomes (cognitive, cerebrovascular, cardiovascular, psychiatric, metabolic, etc.).   Test Findings  Test scores are summarized in additional documentation associated with this encounter. Test scores are relative to age, gender, and educational history as available and appropriate. There were no concerns about performance validity as all findings fell within normal expectations.   General Intellectual Functioning/Achievement:  Performance on single word reading was average and performance on the RIST index was average with comparable average range scores on the verbally and visually oriented subtests. This suggests average as a reasonable standard of comparison for the patient's cognitive test performance.   Attention and Processing Efficiency: Indicators of attention and processing efficiency were high average overall despite some minor intrasubtest variability that does not appear to be in a clinically meaningful pattern and the number of low subtest scores is not abnormal. Performance on measures of digit repetition was low average for digits forward and average for digits backward. Identifying changes to a series of printed stimulus materials was  average for dot patterns and unusually low for driving scenes.   With respect to timed measures of processing speed, there was some indication of speed accuracy trade off in that the patient achieved very good scores for speed but then somewhat lower scores for accuracy. Nevertheless, performance was in the superior range on three such measures involving letter cancellation, letter counting, and alternating attention between mental calculation and letter cancellation. She performed at an average level on a similar task involving mental  addition under time pressure (patient said she was never strong with math).   Language: Performance was normal within this domain with errorless visual object confrontation naming. Generation of words was unusually low in response to the letters F-A-S but she scored in the low average range in response to the category prompt "animals."   Visuospatial Function: Performance on indicators of visuospatial and constructional abilities was within normal limits, with very superior performance on a constructional task involving assembling two-dimensional target designs from a series of small plastic tile pieces. Find visual discrimination and attention to detail was low average.   Learning and Memory: Performance on measures of learning and memory fell at a reasonable average level overall and there are no low subtest scores. She demonstrated good acquisition and retention of information across time.   In there verbal realm, Ms. Ahart demonstrated average immediate recall for a 12-item word list with 6, 10, and 12 words across three learning trials. Short delayed recall was average and long delayed recall was excellent, in the high average range. Recognition discriminability for the words versus false choices was average. Her memory for other material including stories and brief daily-living type information was low average to average on immediate recall with commensurate delayed recall performance and good retention of information over time. Recognition of the daily living information was average.   In the visual realm, she performed at a low average level when learning a series of designs that are difficult to verbally encode. Her delayed recognition for the designs was high average. Recognition discriminability using a yes/no format was a bit lower, in the low average to unusually low range, although that is unconcerning in the context of delayed free recall performance.   Executive  Functions: Performance was good on executive indicators with an average score on the [employer] Composite of a card sorting measure involving cognitive flexibility and concept formation/problem solving. Alternating sequencing of numbers and letters of the alphabet was truly outstanding and fell at a superior level, > 99% of her age and education matched cohort. Generation of words in response to the letters F-A-S was high average.   Rating Scale(s): Ms. Rotert screened in the severe range for depressive symptoms and in the mild range for anxiety symptoms on self-rating measures of those constructs. Qualitatively, many of her responses on the depression measures seemed to involve feelings of low self-worth and inadequacy that might be effective targets for psychotherapeutic intervention. She did endorse having thoughts of harming herself but that she would not carry them out, which was queried with her. She has had those thoughts for a long time but has no intention of acting on them and identified the relationships she has with people who love her as a protective factor.   Bettye Boeck Roseanne Reno, PsyD, ABN Clinical Neuropsychologist  Coding and Compliance  Billing below reflects technician time, my direct face-to-face time with the patient, time spent in test administration, and time spent in professional activities including but not limited to: neuropsychological test  interpretation, integration of neuropsychological test data with clinical history, report preparation, treatment planning, care coordination, and review of diagnostically pertinent medical history or studies.   Services associated with this encounter: Clinical Interview [phone]) plus 160 minutes ([id number]; Neuropsychological Evaluation by Professional)  140 minutes ([id number]; Neuropsychological Testing by Technician)

## 2020-06-22 ENCOUNTER — Encounter: Payer: Self-pay | Admitting: Counselor

## 2020-06-22 ENCOUNTER — Other Ambulatory Visit: Payer: Self-pay

## 2020-06-22 ENCOUNTER — Ambulatory Visit (INDEPENDENT_AMBULATORY_CARE_PROVIDER_SITE_OTHER): Payer: BC Managed Care – PPO | Admitting: Counselor

## 2020-06-22 DIAGNOSIS — G4700 Insomnia, unspecified: Secondary | ICD-10-CM

## 2020-06-22 DIAGNOSIS — F32A Depression, unspecified: Secondary | ICD-10-CM | POA: Insufficient documentation

## 2020-06-22 DIAGNOSIS — F329 Major depressive disorder, single episode, unspecified: Secondary | ICD-10-CM

## 2020-06-22 NOTE — Patient Instructions (Signed)
Your performance and presentation on neuropsychological assessment were entirely normal. That is to say, you performed within the expected range for you in all domains assessed. I would like to highlight that you actually did quite well, with superior scores on several challenging measures of processing speed (which is one of the main deficits we expect to find in individuals with head injury). You also had very good, near errorless delayed word list recall that is better than 84% of age and education matched peers. This is all very reassuring regarding your objective level of cognitive performance. Nevertheless, you are having difficulties day-to-day and I think there are several potential contributing factors, although they are not likely due to your head injury.   When an individual strikes their head and experiences an alteration of consciousness or a brief loss of consciousness without complications like a brain bleed, this is called a concussion or "mild traumatic brain injury." This is not a brain injury per se in terms of causing structural damage to the brain, but it does negatively impact the brain's functioning in terms of energy metabolism (i.e, it is a metabolic injury). Most people experience cognitive symptoms following a concussion, and these typically resolve within hours to days. The vast majority of individuals recover completely in a matter of time, without any measurable lasting cognitive impairments. A small minority of people may experience persisting symptoms, the so-called "postconcussion syndrome," and there is debate about why this is the case. Oftentimes, the symptoms that people report are things that happen with a relatively high frequency in the general population (e.g., things like irritability, difficulties concentrating, and headaches). These things are also associated with depression and anxiety, which are two of the biggest risk factors risk factors for postconcussion syndrome.  Things like changes in routines, pain related to headaches, difficulties readjusting to work and normal activities, financial stress, depression, and anxiety about full recovery may be more important precipitants of postconcussive symptoms than the brain injury itself. My best recommendation is to resume normal levels of activity as tolerated after you are medically cleared to do so and not to overly focus on cognitive problems, which will only make them worse by distracting you and detracting attention from the task at hand.    It is also important to know that cognitive difficulties due to a head injury do not worsen over time, as a rule, unless this is over the course of hours to days immediately after the injury related to bleeding or other serious problems. The fact that you noticed your problems months after your injury is further reassuring that the problems you are having are not due to a brain injury.   You reported a severe level of depressive symptoms. Depression can affect cognitive functioning in several ways. For one, there are neurobiological changes in depression that can contribute to attention, concentration, and memory problems. These changes are so common they are part of the criteria we use to diagnose depressive disorders. Depression can also negatively impact your own appraisal of your cognitive abilities, leading you to feel like you are performing more poorly than is objectively warranted.  The treatments of choice for depression are medication and/or psychotherapy. There is some evidence that patients who choose their preferred method of treatment do better than those who are assigned to treatment arbitrarily, so I would encourage you to feel empowered to get better in the way that makes most sense to you. We discussed this and you will reach out for a referral if  you need one. You could also consult www.psychologytoday.com or Dr. Zola Button regarding a referral.   Excessive  life stress is a risk factor for cognitive difficulties, sleep problems, depression, and other stress-related health outcomes. Your schedule is objectively very challenging and would be a lot for anyone to manage. I would encourage you to consider if there are any things that you may wish to rearrange or scale back. Sometimes, problems sleeping, depression, and memory concerns are a sign that you are taking on too many things. I would also like to highlight that this is a personal decision, at the end of the day, and is also something you could discuss with a psychotherapist in terms of finding balance in your life.   Avoid overfocusing on cognitive performance. Memory and cognition are notoriously fallible and if you are looking for cognitive problems, you are bound to find them. Once someone gets worried about their memory and thinking, they may overfocus on how they are doing day-to-day, and then when normal day-to-day cognitive errors are made, this becomes a cause for more concern. This concern and anxiety then decreases focus from the task at hand, reducing concentration, causing more cognitive problems, and creating a vicious cycle. Rather than critiquing your performance, I would encourage you to remain present minded and focus on the task at hand. Perhaps most importantly, have reasonable expectations for yourself.  Healthy people forget things, lose focus, and do not perform 100% correctly all the time. Some cognitive errors are normal and are not necessarily a sign that there is something wrong with your brain.   There are few things as disruptive to brain functioning as not getting a good night's sleep. For sleep, I recommend against using medications, which can have lingering sedating effects on the brain and rob your brain of restful REM sleep. Instead, consider trying some of the following sleep hygiene recommendations. They may not work at once and may take effort, but the effort you spend is  likely to be rewarded with better sleep eventually:   Stick to a sleep schedule of the same bedtime and wake time even on the weekends, which can help to regulate your body's internal clock so that you fall asleep and stay asleep.   Practice a relaxing bedtime ritual (conducted away from bright lights) which will help separate your sleep from stimulating activities and prepare your body to fall asleep when you go to bed.   Avoid naps, especially in the afternoon.   Evaluate your room and create conditions that will promote sleep such as keeping it cool (between 60 - 67 degrees), quiet, and free from any lights. Consider using blackout curtains, a "white noise" generator, or fan that will help mask any noises that might prevent you from going to sleep or awaken you during the night.   Sleep on a comfortable mattress and pillows.   Avoid bright light in the evening and excessive use of portable electronic devices right before bed that may contain light frequencies that can contribute to sleep problems.   Avoid alcohol, cigarettes, or heavy meals in the evening. If you must eat, consume a light snack 45 minutes before bed.   Use your bed only for sleep to strengthen the association between your bed and sleep.   If you can't go to sleep within 30 minutes, go into another room and do something relaxing until you feel tired. Then, come back and try to go to sleep again for 30 minutes and repeat until sleep  is achieved.   Some people find over the counter melatonin to be helpful for sleep, which you could discuss with a pharmacist or prescribing provider.   You are already exercising, which is great. Establishing the habit of exercise now will hold you in good stead as you age, it is one of the most powerful preventative medicines that we have and improves outcomes in a huge number of different health outcomes (cognitive, cerebrovascular, cardiovascular, psychiatric, metabolic, etc.).

## 2020-06-22 NOTE — Progress Notes (Signed)
   NEUROPSYCHOLOGY FEEDBACK NOTE [facility]  Feedback Note: I met with Sherry Dominguez to review the findings resulting from her neuropsychological evaluation. Since the last appointment, she has been well. 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 Time was spent reviewing the impressions and recommendations that are detailed in the evaluation report. We discussed impression of normal cognitive performance, and in fact very good performance in some areas, as reflected in the patient instructions. I took time to provide her with psychoeducation regarding mTBI, expectation of full recovery and the like. We discussed depression and sleep and frequent contributors to cognitive problems. Discussed sleep hygiene and psychotherapy, she will consider it and call back for a referral if desired. I encouraged [name] to contact me should she have any further questions or if further follow up is desired.   Current Medications and Medical History   Current Outpatient Medications  Medication Sig Dispense Refill  . norgestimate-ethinyl estradiol (ORTHO-CYCLEN) 0.25-35 MG-MCG tablet Take 1 tablet by mouth daily.    . ondansetron (ZOFRAN ODT) 4 MG disintegrating tablet Take 1 tablet (4 mg total) by mouth every 8 (eight) hours as needed for nausea or vomiting. 20 tablet 0   No current facility-administered medications for this visit.    Patient Active Problem List   Diagnosis Date Noted  . Head trauma 04/17/2020  . Memory loss 04/17/2020  . Intractable post-traumatic headache 04/17/2020  . Sinusitis 10/22/2011  . Serous otitis media 07/08/2011  . Viral pharyngitis 06/05/2011  . HYPERTRIGLYCERIDEMIA 02/19/2010  . EPISTAXIS 07/31/2009  . UNSPECIFIED ANEMIA 02/13/2009  . INGROWN TOENAIL 02/13/2009  . OTITIS EXTERNA, ACUTE, BILATERAL 10/06/2008  . GERD 10/06/2008  . PARONYCHIA, LEFT  GREAT TOE 07/27/2008  . VIRAL URI 03/08/2008    Mental Status and Behavioral Observations  Sherry Dominguez presented on time to the present encounter and was alert and generally oriented. Speech was normal in rate, rhythm, volume, and prosody. Self-reported mood was "allright" and affect was euthymic. Thought process was logical and goal oriented and thought content was appropriate. There were no safety concerns identified at today's encounter, such as thoughts of harming self or others.   Plan  Feedback provided regarding the patient's neuropsychological evaluation. She presented as reassured regarding her head injury and her cognitive performance, which was generally quite good. She knows that her mood is not what it could be and will consider psychotherapy because she would prefer to avoid medication, which seems reasonable in her case. Sherry Dominguez was encouraged to contact me if any questions arise or if further follow up is desired.   Viviano Simas Nicole Kindred, PsyD, ABN Clinical Neuropsychologist  Service(s) Provided at This Encounter: 30 minutes [phone]; Psychotherapy with patient/family)

## 2020-08-12 ENCOUNTER — Other Ambulatory Visit: Payer: Self-pay

## 2020-08-12 ENCOUNTER — Emergency Department
Admission: EM | Admit: 2020-08-12 | Discharge: 2020-08-12 | Disposition: A | Discharge status: Home / Self Care | Payer: BC Managed Care – PPO | Source: Home / Self Care | Attending: Family Medicine | Admitting: Family Medicine

## 2020-08-12 DIAGNOSIS — J029 Acute pharyngitis, unspecified: Secondary | ICD-10-CM | POA: Diagnosis not present

## 2020-08-12 LAB — POCT RAPID STREP A (OFFICE): Rapid Strep A Screen: NEGATIVE

## 2020-08-12 MED ORDER — ONDANSETRON 4 MG PO TBDP: 4.0000 mg | ORAL_TABLET | Freq: Three times a day (TID) | ORAL | 0 refills | Status: DC | PRN

## 2020-08-12 MED ORDER — CEPHALEXIN 500 MG PO CAPS: 500.0000 mg | ORAL_CAPSULE | Freq: Two times a day (BID) | ORAL | 0 refills | Status: DC

## 2020-08-12 NOTE — Discharge Instructions (Addendum)
You may use over the counter ibuprofen or acetaminophen as needed.  For a sore throat, over the counter products such as [employer] Peroxyl Mouth Sore Rinse or Chloraseptic Sore Throat Spray may provide some temporary relief. We have sent your throat swab for culture and will let you know of any positive results.

## 2020-08-12 NOTE — ED Provider Notes (Signed)
[facility]   [id number] 08/12/20 Arrival Time: 1044  ASSESSMENT & PLAN:  1. Sore throat    No signs of peritonsillar abscess. Discussed.  Will tx based on exam/symptoms. Throat culture sent. Meds ordered this encounter  Medications  . cephALEXin (KEFLEX) 500 MG capsule    Sig: Take 1 capsule (500 mg total) by mouth 2 (two) times daily.    Dispense:  20 capsule    Refill:  0  . ondansetron (ZOFRAN-ODT) 4 MG disintegrating tablet    Sig: Take 1 tablet (4 mg total) by mouth every 8 (eight) hours as needed for nausea or vomiting.    Dispense:  15 tablet    Refill:  0    OTC analgesics and throat care as needed Will follow up if not showing significant improvement over the next 24-48 hours. Work note provided.    Discharge Instructions      You may use over the counter ibuprofen or acetaminophen as needed.   For a sore throat, over the counter products such as [employer] Peroxyl Mouth Sore Rinse or Chloraseptic Sore Throat Spray may provide some temporary relief.  We have sent your throat swab for culture and will let you know of any positive results.      Reviewed expectations re: course of current medical issues. Questions answered. Outlined signs and symptoms indicating need for more acute intervention. Patient verbalized understanding. After Visit Summary given.   SUBJECTIVE:  Sherry Dominguez is a 20 y.o. female who reports a sore throat. Describes as painful swallowing. Onset abrupt beginning 2 d ago. Symptoms have gradually worsened since beginning; with slight voice changes. No respiratory symptoms. Normal PO intake but reports discomfort with swallowing. No specific alleviating factors. Fever:subjective, temp not taken. No neck pain or swelling. No associated nausea, vomiting, or abdominal pain. Known sick contacts: none. Recent travel: none.  OBJECTIVE:  Vitals:   08/12/20 1105 08/12/20 1109  BP:  (!) 110/58  Pulse:  79  Resp:  20  Temp:  98.5  F (36.9 C)  TempSrc:  Oral  SpO2:  97%  Weight: 93 kg   Height: 5\' 8"  (1.727 m)      General appearance: alert; no distress HEENT: throat with marked erythema and bilateral tonsil enlargement with exudates; uvula is midline Neck: supple with FROM Lungs: speaks full sentences without difficulty; unlabored Abd: soft; non-tender Skin: reveals no rash; warm and dry Psychological: alert and cooperative; normal mood and affect  Allergies  Allergen Reactions  . Sulfa Antibiotics Hives  . Clindamycin/Lincomycin Nausea And Vomiting  . Amoxicillin Nausea And Vomiting  . Bactrim [Sulfamethoxazole-Trimethoprim]   . Penicillins Nausea And Vomiting  . Red Dye Nausea And Vomiting    History reviewed. No pertinent past medical history. Social History   Socioeconomic History  . Marital status: Single    Spouse name: Not on file  . Number of children: Not on file  . Years of education: Not on file  . Highest education level: Not on file  Occupational History  . Not on file  Tobacco Use  . Smoking status: Former  . Smokeless tobacco: Never Used  Vaping Use  . Vaping Use: Some days  Substance and Sexual Activity  . Alcohol use: Yes    Comment: occasionally  . Drug use: No  . Sexual activity: Not on file  Other Topics Concern  . Not on file  Social History Narrative  . Not on file   Social Determinants of Health  Financial Resource Strain: Not on file  Food Insecurity: Not on file  Transportation Needs: Not on file  Physical Activity: Not on file  Stress: Not on file  Social Connections: Not on file  Intimate Partner Violence: Not on file   Family History  Problem Relation Age of Onset  . Hypertension Father   . Hyperlipidemia Father   . Healthy Mother           Mardella Layman, MD 08/12/20 1128

## 2020-08-12 NOTE — ED Triage Notes (Signed)
Pt presents to Urgent Care with c/o sore throat and headache x 2 days. No home COVID test done; has not been vaccinated.

## 2020-08-17 LAB — CULTURE, GROUP A STREP: Strep A Culture: NEGATIVE

## 2020-09-04 ENCOUNTER — Ambulatory Visit: Payer: BC Managed Care – PPO | Admitting: Family Medicine

## 2020-09-04 ENCOUNTER — Other Ambulatory Visit: Payer: Self-pay

## 2020-09-04 ENCOUNTER — Encounter: Payer: Self-pay | Admitting: Family Medicine

## 2020-09-04 VITALS — BP 110/72 | HR 84 | Temp 98.7°F | Resp 18 | Ht 68.0 in | Wt 216.2 lb

## 2020-09-04 DIAGNOSIS — J039 Acute tonsillitis, unspecified: Secondary | ICD-10-CM | POA: Diagnosis not present

## 2020-09-04 MED ORDER — CLARITHROMYCIN ER 500 MG PO TB24: 1000.0000 mg | ORAL_TABLET | Freq: Every day | ORAL | 0 refills | Status: DC

## 2020-09-04 MED ORDER — PREDNISONE 10 MG PO TABS: ORAL_TABLET | ORAL | 0 refills | Status: DC

## 2020-09-04 NOTE — Patient Instructions (Signed)
Tonsillitis  Tonsillitis is an infection of the throat that causes the tonsils to become red, tender, and swollen. Tonsils are tissues in the back of your throat. Each tonsil has crevices (crypts). Tonsils normally work to protect the body from infection. What are the causes? Sudden (acute) tonsillitis may be caused by a virus or bacteria, including streptococcal bacteria. Long-lasting (chronic) tonsillitis occurs when the crypts of the tonsils become filled with pieces of food and bacteria, which makes it easy for the tonsils to become repeatedlyinfected. Tonsillitis can be spread from person to person (is contagious). It may be spread by inhaling droplets that are released with coughing or sneezing. You may also come into contact with viruses or bacteria on surfaces,such as cups or utensils. What are the signs or symptoms? Symptoms of this condition include: A sore throat. This may include trouble swallowing. White patches on the tonsils. Swollen tonsils. Fever. Headache. Tiredness. Loss of appetite. Snoring during sleep when you did not snore before. Small, foul-smelling, yellowish-white pieces of material (tonsilloliths) that you occasionally cough up or spit out. These can cause you to have bad breath. How is this diagnosed? This condition is diagnosed with a physical exam. Diagnosis can be confirmedwith the results of lab tests, including a throat culture. How is this treated? Treatment for this condition depends on the cause, but usually focuses on treating the symptoms associated with it. Treatment may include: Medicines to relieve pain and manage fever. Steroid medicines to reduce swelling. Antibiotic medicines if the condition is caused by bacteria. If attacks of tonsillitis are severe and frequent, your health care provider may recommend surgery to remove the tonsils (tonsillectomy). Follow these instructions at home: Medicines Take over-the-counter and prescription medicines  only as told by your health care provider. If you were prescribed an antibiotic medicine, take it as told by your health care provider. Do not stop taking the antibiotic even if you start to feel better. Eating and drinking Drink enough fluid to keep your urine clear or pale yellow. While your throat is sore, eat soft or liquid foods, such as sherbet, soups, or instant breakfast drinks. Drink warm liquids. Eat frozen ice pops. General instructions Rest as much as possible and get plenty of sleep. Gargle with a salt-water mixture 3-4 times a day or as needed. To make a salt-water mixture, completely dissolve -1 tsp of salt in 1 cup of warm water. Do not swallow salt-water mixture. Wash your hands regularly with soap and water. If soap and water are not available, use hand sanitizer. Do not share cups, bottles, or other utensils until your symptoms have gone away. Do not smoke. This can help your symptoms and prevent the infection from coming back. If you need help quitting, ask your health care provider. Keep all follow-up visits as told by your health care provider. This is important. Contact a health care provider if: You notice large, tender lumps in your neck that were not there before. You have a fever that does not go away after 2-3 days. You develop a rash. You cough up a green, yellow-brown, or bloody substance. You cannot swallow liquids or food for 24 hours. Only one of your tonsils is swollen. Get help right away if: You develop any new symptoms, such as vomiting, severe headache, stiff neck, chest pain, trouble breathing, or trouble swallowing. You have severe throat pain along with drooling or voice changes. You have severe pain that is not controlled with medicines. You cannot fully open your mouth. You   develop redness, swelling, or severe pain anywhere in your neck. Summary Tonsillitis is an infection of the throat that causes the tonsils to become red, tender, and  swollen. Tonsillitis may be caused by a virus or bacteria. Rest as much as possible. Get plenty of sleep. Get help right away if you develop any new symptoms, such as vomiting, severe headache, stiff neck, chest pain, or trouble breathing. This information is not intended to replace advice given to you by your health care provider. Make sure you discuss any questions you have with your healthcare provider. Document Revised: 02/24/2020 Document Reviewed: 02/10/2020 Elsevier Patient Education  2022 [employer].

## 2020-09-04 NOTE — Assessment & Plan Note (Signed)
pred taper  zithromax Refer to ent  rto prn

## 2020-09-04 NOTE — Progress Notes (Signed)
Patient ID: Sherry Dominguez, female    DOB: 26-Feb-2001  Age: 20 y.o. MRN: [id number]    Subjective:  Subjective  HPI Taneah Masri presents for an office visit and f/u from urgent care today. Pt was at the ED on 08/12/2020 fore sore throat and was dx with acute pharyngitis. She complains of swelling in the throat. She notes that she has trouble swallowing certain foods.  She denies any pain. She endorses taking 500 mg of cephALEXin PO BID and finished it x 1-2 weeks. She states that her Sxs had improved while on antibiotics, however swelling had occurred. She notes that she used to have strep every year while as a child. She reports that her mother had strep and had a tonsillectomy.  She denies any chest pain, SOB, fever, abdominal pain, cough, chills, sore throat, dysuria, urinary incontinence, back pain, HA, or N/V/D at this time.    Review of Systems  Constitutional:  Negative for chills, fatigue and fever.  HENT:  Positive for trouble swallowing. Negative for ear pain, rhinorrhea, sinus pressure, sinus pain and tinnitus.        (+) swelling in the throat    Eyes:  Negative for pain.  Respiratory:  Negative for cough, shortness of breath and wheezing.   Cardiovascular:  Negative for chest pain.  Gastrointestinal:  Negative for abdominal pain, anal bleeding, constipation, diarrhea, nausea and vomiting.  Genitourinary:  Negative for flank pain.  Musculoskeletal:  Negative for back pain and neck pain.  Skin:  Negative for rash.  Neurological:  Negative for seizures, weakness, light-headedness, numbness and headaches.   History No past medical history on file.  She has a past surgical history that includes Cholecystectomy.   Her family history includes Healthy in her mother; Hyperlipidemia in her father; Hypertension in her father.She reports that she has quit smoking. She has never used smokeless tobacco. She reports current alcohol use. She reports that she does not use  drugs.  Current Outpatient Medications on File Prior to Visit  Medication Sig Dispense Refill   ibuprofen (ADVIL) 400 MG tablet Take 400 mg by mouth every 6 (six) hours as needed.     norgestimate-ethinyl estradiol (ORTHO-CYCLEN) 0.25-35 MG-MCG tablet Take 1 tablet by mouth daily.     ondansetron (ZOFRAN-ODT) 4 MG disintegrating tablet Take 1 tablet (4 mg total) by mouth every 8 (eight) hours as needed for nausea or vomiting. 15 tablet 0   No current facility-administered medications on file prior to visit.     Objective:  Objective  Physical Exam Vitals and nursing note reviewed.  Constitutional:      General: She is not in acute distress.    Appearance: Normal appearance. She is well-developed. She is not ill-appearing.  HENT:     Head: Normocephalic and atraumatic.     Right Ear: External ear normal.     Left Ear: External ear normal.     Nose: Nose normal.     Mouth/Throat:     Comments: There is erythema and hypertrophy present in the tonsils.  Eyes:     General:        Right eye: No discharge.        Left eye: No discharge.     Extraocular Movements: Extraocular movements intact.     Pupils: Pupils are equal, round, and reactive to light.  Cardiovascular:     Rate and Rhythm: Normal rate and regular rhythm.     Pulses: Normal pulses.     Heart sounds:  Normal heart sounds. No murmur heard.   No friction rub. No gallop.  Pulmonary:     Effort: Pulmonary effort is normal. No respiratory distress.     Breath sounds: Normal breath sounds. No stridor. No wheezing, rhonchi or rales.  Chest:     Chest wall: No tenderness.  Abdominal:     General: Bowel sounds are normal. There is no distension.     Palpations: Abdomen is soft. There is no mass.     Tenderness: There is no abdominal tenderness. There is no guarding or rebound.     Hernia: No hernia is present.  Musculoskeletal:        General: Normal range of motion.     Cervical back: Normal range of motion and neck  supple.     Right lower leg: No edema.     Left lower leg: No edema.  Skin:    General: Skin is warm and dry.  Neurological:     Mental Status: She is alert and oriented to person, place, and time.  Psychiatric:        Behavior: Behavior normal.        Thought Content: Thought content normal.   BP 110/72 (BP Location: Right Arm, Patient Position: Sitting, Cuff Size: Normal)   Pulse 84   Temp 98.7 F (37.1 C) (Oral)   Resp 18   Ht 5\' 8"  (1.727 m)   Wt 216 lb 3.2 oz (98.1 kg)   SpO2 98%   BMI 32.87 kg/m  Wt Readings from Last 3 Encounters:  09/04/20 216 lb 3.2 oz (98.1 kg)  08/12/20 205 lb (93 kg)  04/17/20 216 lb 6.4 oz (98.2 kg)     Lab Results  Component Value Date   WBC 5.6 04/17/2020   HGB 12.3 04/17/2020   HCT 36.9 04/17/2020   PLT 257.0 04/17/2020   GLUCOSE 76 04/17/2020   CHOL 176 02/13/2010   TRIG 258.0 (H) 02/13/2010   HDL 32.60 (L) 02/13/2010   LDLDIRECT 112.0 02/13/2010   ALT 10 04/17/2020   AST 12 04/17/2020   NA 136 04/17/2020   K 4.6 04/17/2020   CL 104 04/17/2020   CREATININE 0.76 04/17/2020   BUN 13 04/17/2020   CO2 27 04/17/2020    No results found.   Assessment & Plan:  Plan   Meds ordered this encounter  Medications   predniSONE (DELTASONE) 10 MG tablet    Sig: TAKE 3 TABLETS PO QD FOR 3 DAYS THEN TAKE 2 TABLETS PO QD FOR 3 DAYS THEN TAKE 1 TABLET PO QD FOR 3 DAYS THEN TAKE 1/2 TAB PO QD FOR 3 DAYS    Dispense:  20 tablet    Refill:  0   clarithromycin (BIAXIN XL) 500 MG 24 hr tablet    Sig: Take 2 tablets (1,000 mg total) by mouth daily.    Dispense:  14 tablet    Refill:  0    Problem List Items Addressed This Visit       Unprioritized   Tonsillitis - Primary    pred taper  zithromax Refer to ent  rto prn        Relevant Medications   predniSONE (DELTASONE) 10 MG tablet   clarithromycin (BIAXIN XL) 500 MG 24 hr tablet   Other Relevant Orders   Ambulatory referral to ENT    Follow-up: Return if symptoms worsen or  fail to improve.   I,Gordon Zheng,acting as a 06/15/2020 for Neurosurgeon, DO.,have documented all  relevant documentation on the behalf of Donato Schultz, DO,as directed by  Donato Schultz, DO while in the presence of Donato Schultz, DO.  I, Donato Schultz, DO, have reviewed all documentation for this visit. The documentation on 09/04/20 for the exam, diagnosis, procedures, and orders are all accurate and complete.

## 2020-11-02 DIAGNOSIS — Z6833 Body mass index (BMI) 33.0-33.9, adult: Secondary | ICD-10-CM | POA: Diagnosis not present

## 2020-11-02 DIAGNOSIS — Z01419 Encounter for gynecological examination (general) (routine) without abnormal findings: Secondary | ICD-10-CM | POA: Diagnosis not present

## 2020-11-02 DIAGNOSIS — Z113 Encounter for screening for infections with a predominantly sexual mode of transmission: Secondary | ICD-10-CM | POA: Diagnosis not present

## 2020-11-22 ENCOUNTER — Other Ambulatory Visit: Payer: Self-pay

## 2020-11-22 ENCOUNTER — Ambulatory Visit: Payer: BC Managed Care – PPO | Admitting: Family Medicine

## 2020-11-22 ENCOUNTER — Encounter: Payer: Self-pay | Admitting: Family Medicine

## 2020-11-22 VITALS — BP 102/60 | HR 80 | Temp 98.7°F | Ht 68.0 in | Wt 226.4 lb

## 2020-11-22 DIAGNOSIS — J029 Acute pharyngitis, unspecified: Secondary | ICD-10-CM | POA: Diagnosis not present

## 2020-11-22 LAB — MONONUCLEOSIS SCREEN: Mono Screen: NEGATIVE

## 2020-11-22 NOTE — Patient Instructions (Signed)
Go back on the Flonase.  We will be in touch regarding your results.   Let us know if you need anything.

## 2020-11-22 NOTE — Progress Notes (Signed)
Chief Complaint  Patient presents with   Sore Throat    Subjective: Patient is a 20 y.o. female here for ST.  3 mo for intermittently. Using ibuprofen and lemon water. Has ENT appt next week. Prednisone did not help in June when she took a 30 mg taper.  She has swelling and intermittent pain.  No dysphagia.  She has associated runny and stuffy nose, sometimes will cough.  She has no fevers, history of reflux, abdominal pain, shortness of breath, wheezing.  History reviewed. No pertinent past medical history.  Objective: BP 102/60   Pulse 80   Temp 98.7 F (37.1 C) (Oral)   Ht 5\' 8"  (1.727 m)   Wt 226 lb 6 oz (102.7 kg)   SpO2 99%   BMI 34.42 kg/m  General: Awake, appears stated age HEENT: MMM, EOMi, tonsils are cryptic and pink without erythema or exudate; the right tonsil is 2+, left tonsils 1+; I did probe the right tonsil and there is no exudate/expression of material nor was there any tenderness elucidated Heart: RRR Lungs: CTAB, no rales, wheezes or rhonchi. No accessory muscle use Neck: No cervical adenopathy, submandibular glands are slightly enlarged bilaterally but without tenderness or fluctuance Psych: Age appropriate judgment and insight, normal affect and mood  Assessment and Plan: Sore throat - Plan: Monospot, Novel Coronavirus, NAA ([facility])  Chronic, unstable, uncertain prognosis.  Check for above, if normal will give a letter to return to work without restrictions.  She had a negative strep test in June of this year.  0/4 Cent[facility] criteria.  Recommended intranasal corticosteroid until she sees ENT. The patient voiced understanding and agreement to the plan.  I spent 32 minutes with the patient discussing the above, reviewing her chart on the same day of her visit.  July [place], DO 11/22/20  2:56 PM

## 2020-11-23 LAB — SARS-COV-2, NAA 2 DAY TAT

## 2020-11-23 LAB — NOVEL CORONAVIRUS, NAA: SARS-CoV-2, NAA: NOT DETECTED

## 2020-11-24 ENCOUNTER — Encounter: Payer: Self-pay | Admitting: Family Medicine

## 2020-11-30 DIAGNOSIS — H9313 Tinnitus, bilateral: Secondary | ICD-10-CM | POA: Diagnosis not present

## 2020-11-30 DIAGNOSIS — J312 Chronic pharyngitis: Secondary | ICD-10-CM | POA: Diagnosis not present

## 2021-03-13 ENCOUNTER — Other Ambulatory Visit: Payer: Self-pay

## 2021-03-13 ENCOUNTER — Encounter: Payer: Self-pay | Admitting: Emergency Medicine

## 2021-03-13 ENCOUNTER — Emergency Department (INDEPENDENT_AMBULATORY_CARE_PROVIDER_SITE_OTHER)
Admission: EM | Admit: 2021-03-13 | Discharge: 2021-03-13 | Disposition: A | Discharge status: Home / Self Care | Payer: BC Managed Care – PPO | Source: Home / Self Care | Attending: Family Medicine | Admitting: Family Medicine

## 2021-03-13 DIAGNOSIS — U071 COVID-19: Secondary | ICD-10-CM | POA: Diagnosis not present

## 2021-03-13 LAB — POCT INFLUENZA A/B
Influenza A, POC: NEGATIVE
Influenza B, POC: NEGATIVE

## 2021-03-13 LAB — POC SARS CORONAVIRUS 2 AG -  ED: SARS Coronavirus 2 Ag: POSITIVE — AB

## 2021-03-13 NOTE — ED Provider Notes (Signed)
[facility]    CSN: [id number] Arrival date & time: 03/13/21  1119      History   Chief Complaint Chief Complaint  Patient presents with   Sore Throat    HPI Sherry Dominguez is a 21 y.o. female.   HPI  Patient works as a Academic librarian.  She is around the public a lot.  She has not had COVID vaccinations.  She had COVID in November 2020 and in January 2021.  Currently is here for sore throat body aches and temperature since yesterday.  She has a headache.  No coughing, chest congestion, or shortness of breath  History reviewed. No pertinent past medical history.  Patient Active Problem List   Diagnosis Date Noted   Tonsillitis 09/04/2020   Depressive disorder 06/22/2020   Insomnia 06/22/2020   Head trauma 04/17/2020   Memory loss 04/17/2020   Intractable post-traumatic headache 04/17/2020   Sinusitis 10/22/2011   Serous otitis media 07/08/2011   Viral pharyngitis 06/05/2011   HYPERTRIGLYCERIDEMIA 02/19/2010   EPISTAXIS 07/31/2009   UNSPECIFIED ANEMIA 02/13/2009   INGROWN TOENAIL 02/13/2009   OTITIS EXTERNA, ACUTE, BILATERAL 10/06/2008   GERD 10/06/2008   PARONYCHIA, LEFT GREAT TOE 07/27/2008   VIRAL URI 03/08/2008    Past Surgical History:  Procedure Laterality Date   CHOLECYSTECTOMY      OB History   No obstetric history on file.      Home Medications    Prior to Admission medications   Medication Sig Start Date End Date Taking? Authorizing Provider  ibuprofen (ADVIL) 400 MG tablet Take 400 mg by mouth every 6 (six) hours as needed. Patient not taking: Reported on 03/13/2021    [provider]  norgestimate-ethinyl estradiol (ORTHO-CYCLEN) 0.25-35 MG-MCG tablet Take 1 tablet by mouth daily.    [provider]  ondansetron (ZOFRAN-ODT) 4 MG disintegrating tablet Take 1 tablet (4 mg total) by mouth every 8 (eight) hours as needed for nausea or vomiting. Patient not taking: Reported on 03/13/2021 08/12/20   Vanessa Kick,  MD    Family History Family History  Problem Relation Age of Onset   Hypertension Father    Hyperlipidemia Father    Healthy Mother     Social History Social History   Tobacco Use   Smoking status: Some Days    Types: Cigarettes   Smokeless tobacco: Never  Vaping Use   Vaping Use: Some days   Substances: Nicotine, Flavoring  Substance Use Topics   Alcohol use: Yes    Alcohol/week: 6.0 standard drinks    Types: 6 Standard drinks or equivalent per week   Drug use: No     Allergies   Sulfa antibiotics, Clindamycin/lincomycin, Amoxicillin, Bactrim [sulfamethoxazole-trimethoprim], Penicillins, and Red dye   Review of Systems Review of Systems  See HPI Physical Exam Triage Vital Signs ED Triage Vitals  Enc Vitals Group     BP 03/13/21 1145 107/72     Pulse Rate 03/13/21 1145 74     Resp 03/13/21 1145 15     Temp 03/13/21 1145 98.8 F (37.1 C)     Temp Source 03/13/21 1145 Oral     SpO2 03/13/21 1145 100 %     Weight 03/13/21 1147 220 lb (99.8 kg)     Height 03/13/21 1147 5\' 8"  (1.727 m)     Head Circumference --      Peak Flow --      Pain Score 03/13/21 1147 6     Pain Loc --  Pain Edu? --      Excl. in [place]? --    No data found.  Updated Vital Signs BP 107/72 (BP Location: Left Arm)    Pulse 74    Temp 98.8 F (37.1 C) (Oral)    Resp 15    Ht 5\' 8"  (1.727 m)    Wt 99.8 kg    LMP 02/28/2021 (Approximate)    SpO2 100%    BMI 33.45 kg/m      Physical Exam Constitutional:      General: She is not in acute distress.    Appearance: She is well-developed.  HENT:     Head: Normocephalic and atraumatic.     Nose:     Comments: Mask is in place Eyes:     Conjunctiva/sclera: Conjunctivae normal.     Pupils: Pupils are equal, round, and reactive to light.  Cardiovascular:     Rate and Rhythm: Normal rate.  Pulmonary:     Effort: Pulmonary effort is normal. No respiratory distress.  Abdominal:     General: There is no distension.     Palpations:  Abdomen is soft.  Musculoskeletal:        General: Normal range of motion.     Cervical back: Normal range of motion.  Skin:    General: Skin is warm and dry.  Neurological:     Mental Status: She is alert.  Psychiatric:        Mood and Affect: Mood normal.        Behavior: Behavior normal.     UC Treatments / Results  Labs (all labs ordered are listed, but only abnormal results are displayed) Labs Reviewed  POC SARS CORONAVIRUS 2 AG -  ED - Abnormal; Notable for the following components:      Result Value   SARS Coronavirus 2 Ag Positive (*)    All other components within normal limits  POCT INFLUENZA A/B    EKG   Radiology No results found.  Procedures Procedures (including critical care time)  Medications Ordered in UC Medications - No data to display  Initial Impression / Assessment and Plan / UC Course  I have reviewed the triage vital signs and the nursing notes.  Pertinent labs & imaging results that were available during my care of the patient were reviewed by me and considered in my medical decision making (see chart for details).     Reviewed quarantine, home treatment, return to work Final Clinical Impressions(s) / UC Diagnoses   Final diagnoses:  COVID-19     Discharge Instructions      Call for problems     ED Prescriptions   None    PDMP not reviewed this encounter.   Raylene Everts, MD 03/13/21 [phone]

## 2021-03-13 NOTE — ED Triage Notes (Signed)
Sore throat & body aches since yesterday  Has not checked temp Ibuprofen for HA @ 0815 (800mg ) - no relief  No flu vaccine  No COVID vaccine  COVID x 2 (11/20 & 1/21)

## 2021-03-13 NOTE — Discharge Instructions (Signed)
Call for problems.

## 2022-01-19 IMAGING — MR MR HEAD W/O CM
11 series · 48 of 48 positions shown · non-contrast
Comparison: None.

CLINICAL DATA: Severe headaches, memory issues, concussion in
November 2019

EXAM:
MRI HEAD WITHOUT CONTRAST
TECHNIQUE: Multiplanar, multiecho pulse sequences of the brain and surrounding
structures were obtained without intravenous contrast.

[Series 5: DWI · axial · 3.0mm · 1.36mm/px · z∈[-58,+101]mm · 7 of 107 slices shown (1 of 4)]
[im 1/107]
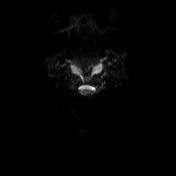
[im 18/107]
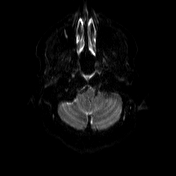
[im 36/107]
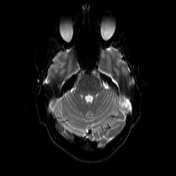
[im 54/107]
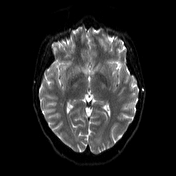
[im 71/107]
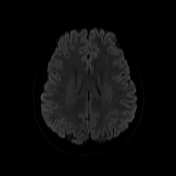
[im 89/107]
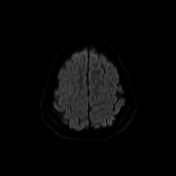
[im 107/107]
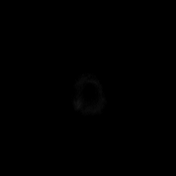

[Series 6: DWI · axial · 3.0mm · 1.36mm/px · z∈[-58,+101]mm · 4 of 54 slices shown (2 of 4)]
[im 1/54]
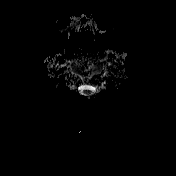
[im 18/54]
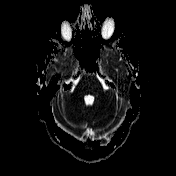
[im 36/54]
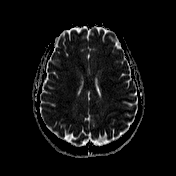
[im 54/54]
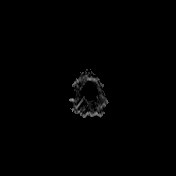

[Series 7: mip_images(sw) · axial · 24.0mm · 0.69mm/px · z∈[-40,+92]mm · 3 of 45 slices shown]
[im 1/45]
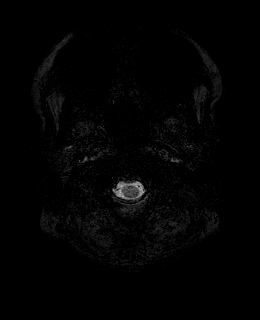
[im 23/45]
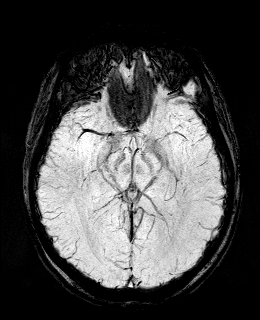
[im 45/45]
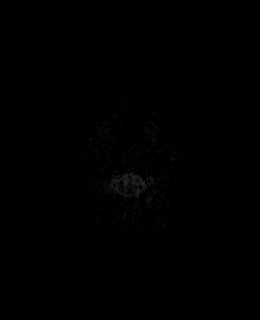

[Series 8: swi_images · axial · 3.0mm · 0.69mm/px · z∈[-50,+103]mm · 4 of 52 slices shown]
[im 1/52]
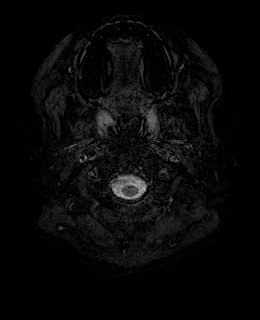
[im 18/52]
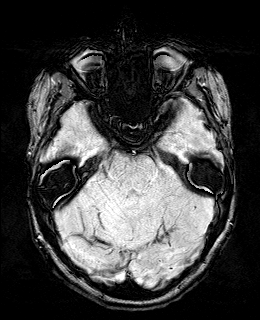
[im 35/52]
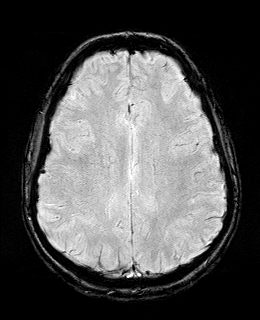
[im 52/52]
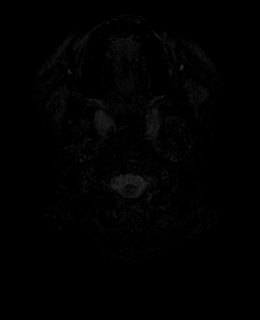

[Series 9: FLAIR · axial · 3.0mm · 0.69mm/px · z∈[-53,+103]mm · 4 of 53 slices shown]
[im 1/53]
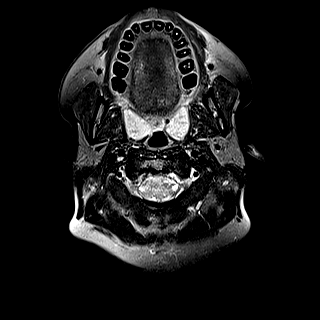
[im 18/53]
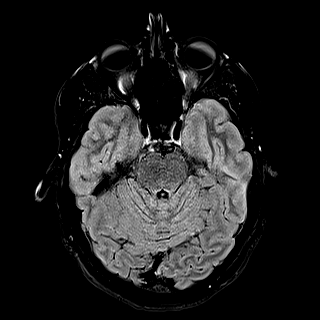
[im 35/53]
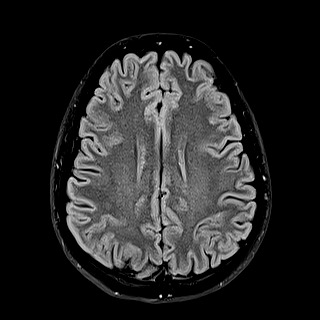
[im 53/53]
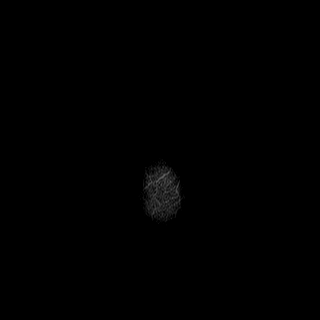

[Series 10: T1 · sagittal · 5.0mm · 0.75mm/px · 2 of 25 slices shown (1 of 2)]
[im 1/25]
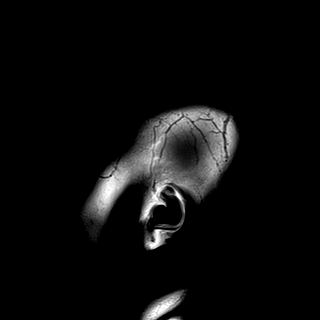
[im 25/25]
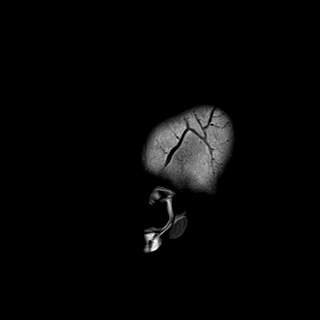

[Series 11: T2 · axial · 5.0mm · 0.57mm/px · z∈[-54,+101]mm · 2 of 25 slices shown (1 of 2)]
[im 1/25]
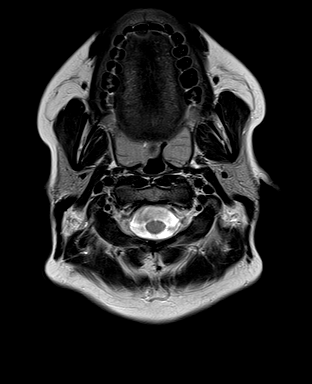
[im 25/25]
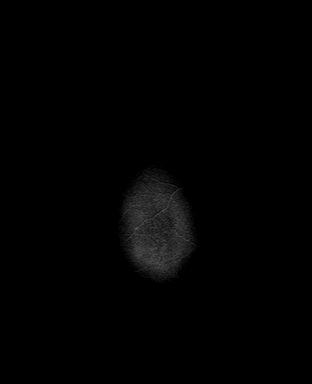

[Series 12: T1 · axial · 1.0mm · 0.86mm/px · z∈[-55,+104]mm · 12 of 160 slices shown (2 of 2)]
[im 1/160]
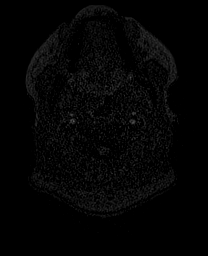
[im 15/160]
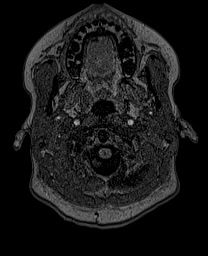
[im 29/160]
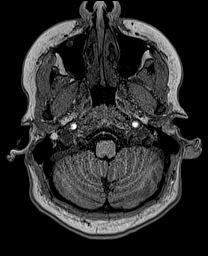
[im 44/160]
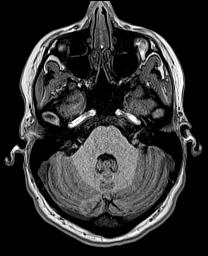
[im 58/160]
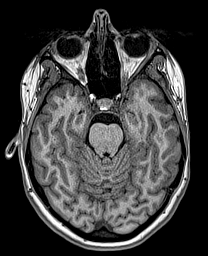
[im 73/160]
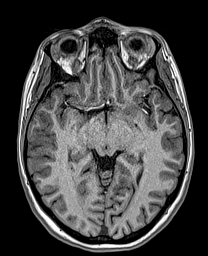
[im 87/160]
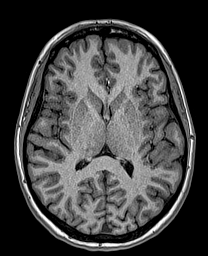
[im 102/160]
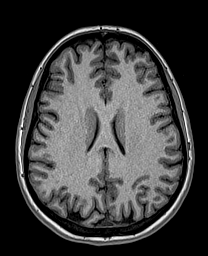
[im 116/160]
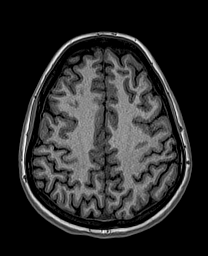
[im 131/160]
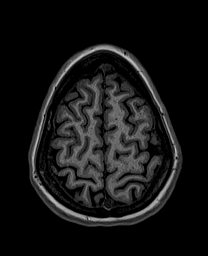
[im 145/160]
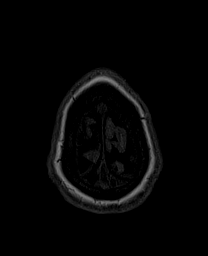
[im 160/160]
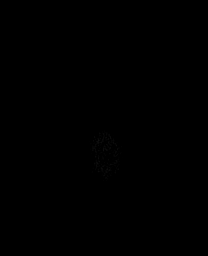

[Series 13: DWI · coronal · 5.0mm · 1.31mm/px · 5 of 76 slices shown (3 of 4)]
[im 1/76]
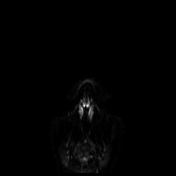
[im 19/76]
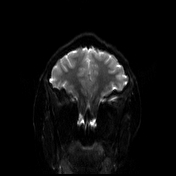
[im 38/76]
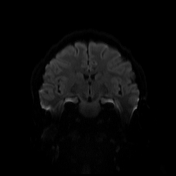
[im 57/76]
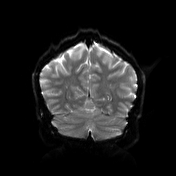
[im 76/76]
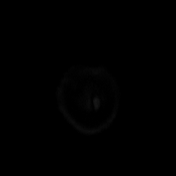

[Series 14: DWI · coronal · 5.0mm · 1.31mm/px · 3 of 38 slices shown (4 of 4)]
[im 1/38]
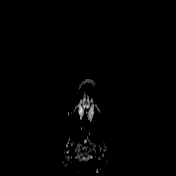
[im 19/38]
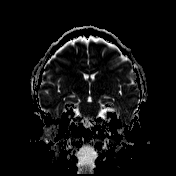
[im 38/38]
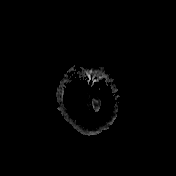

[Series 15: T2 · coronal · 5.0mm · 0.57mm/px · 2 of 30 slices shown (2 of 2)]
[im 1/30]
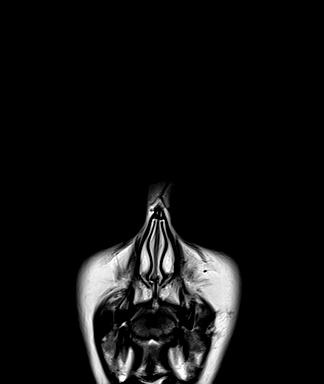
[im 30/30]
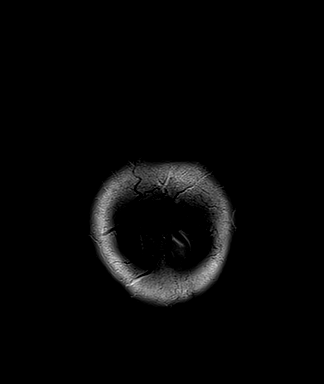

[48 of 48 positions shown; findings below may reference images not displayed]

FINDINGS: Brain: There is no acute infarction or intracranial hemorrhage.
There is no intracranial mass, mass effect, or edema. There is no
hydrocephalus or extra-axial fluid collection. Ventricles and sulci
are normal in size and configuration.

Vascular: Major vessel flow voids at the skull base are preserved.

Skull and upper cervical spine: Normal marrow signal is preserved.

Sinuses/Orbits: Minor mucosal thickening.  Orbits are unremarkable.

Other: Sella is unremarkable.  Mastoid air cells are clear.
IMPRESSION: Normal MRI of the brain.

## 2022-05-13 ENCOUNTER — Other Ambulatory Visit: Payer: Self-pay | Admitting: Family Medicine

## 2022-05-13 ENCOUNTER — Encounter: Payer: Self-pay | Admitting: Family Medicine

## 2022-05-13 ENCOUNTER — Ambulatory Visit: Payer: Commercial Managed Care - PPO | Admitting: Family Medicine

## 2022-05-13 ENCOUNTER — Ambulatory Visit (HOSPITAL_BASED_OUTPATIENT_CLINIC_OR_DEPARTMENT_OTHER)
Admission: RE | Admit: 2022-05-13 | Discharge: 2022-05-13 | Disposition: A | Discharge status: Home / Self Care | Payer: Commercial Managed Care - PPO | Source: Ambulatory Visit | Attending: Family Medicine | Admitting: Family Medicine

## 2022-05-13 VITALS — BP 118/82 | HR 87 | Temp 98.6°F | Resp 18 | Ht 68.0 in | Wt 229.0 lb

## 2022-05-13 DIAGNOSIS — R1032 Left lower quadrant pain: Secondary | ICD-10-CM | POA: Insufficient documentation

## 2022-05-13 DIAGNOSIS — K5901 Slow transit constipation: Secondary | ICD-10-CM | POA: Diagnosis not present

## 2022-05-13 DIAGNOSIS — K5909 Other constipation: Secondary | ICD-10-CM | POA: Insufficient documentation

## 2022-05-13 LAB — CBC WITH DIFFERENTIAL/PLATELET
Basophils Absolute: 0 10*3/uL (ref 0.0–0.1)
Basophils Relative: 0.3 % (ref 0.0–3.0)
Eosinophils Absolute: 0 10*3/uL (ref 0.0–0.7)
Eosinophils Relative: 0.3 % (ref 0.0–5.0)
HCT: 39.4 % (ref 36.0–46.0)
Hemoglobin: 13.3 g/dL (ref 12.0–15.0)
Lymphocytes Relative: 35.4 % (ref 12.0–46.0)
Lymphs Abs: 2.3 10*3/uL (ref 0.7–4.0)
MCHC: 33.7 g/dL (ref 30.0–36.0)
MCV: 87.3 fl (ref 78.0–100.0)
Monocytes Absolute: 0.5 10*3/uL (ref 0.1–1.0)
Monocytes Relative: 7.7 % (ref 3.0–12.0)
Neutro Abs: 3.6 10*3/uL (ref 1.4–7.7)
Neutrophils Relative %: 56.3 % (ref 43.0–77.0)
Platelets: 271 10*3/uL (ref 150.0–400.0)
RBC: 4.51 Mil/uL (ref 3.87–5.11)
RDW: 12.7 % (ref 11.5–15.5)
WBC: 6.4 10*3/uL (ref 4.0–10.5)

## 2022-05-13 LAB — COMPREHENSIVE METABOLIC PANEL
ALT: 12 U/L (ref 0–35)
AST: 12 U/L (ref 0–37)
Albumin: 4 g/dL (ref 3.5–5.2)
Alkaline Phosphatase: 53 U/L (ref 39–117)
BUN: 8 mg/dL (ref 6–23)
CO2: 26 mEq/L (ref 19–32)
Calcium: 10.1 mg/dL (ref 8.4–10.5)
Chloride: 104 mEq/L (ref 96–112)
Creatinine, Ser: 0.79 mg/dL (ref 0.40–1.20)
GFR: 105.41 mL/min (ref >60.00)
Glucose, Bld: 77 mg/dL (ref 70–99)
Potassium: 4.4 mEq/L (ref 3.5–5.1)
Sodium: 138 mEq/L (ref 135–145)
Total Bilirubin: 0.4 mg/dL (ref 0.2–1.2)
Total Protein: 7.2 g/dL (ref 6.0–8.3)

## 2022-05-13 LAB — HCG, QUANTITATIVE, PREGNANCY: Quantitative HCG: <0.6 m[IU]/mL

## 2022-05-13 MED ORDER — LUBIPROSTONE 24 MCG PO CAPS: 24.0000 ug | ORAL_CAPSULE | Freq: Two times a day (BID) | ORAL | 1 refills | Status: AC

## 2022-05-13 NOTE — Assessment & Plan Note (Signed)
Pt has already been taking daily fiber , miralax and other otc laxatives She took mag citrate last week  with good results but still had episode of LLQ pain  Check xray ---  if LLQ pain returns consider CT

## 2022-05-13 NOTE — Progress Notes (Signed)
Established Patient Office Visit  Subjective   Patient ID: Sherry Dominguez, female    DOB: 2000-10-07  Age: 22 y.o. MRN: [id number]  Chief Complaint  Patient presents with   GI Problem    Sxs starting Jan with infreq bowel movements and states left side pain. Pt states taking liquid magnesium last week and cleaned her out. Pt states trying Miralax and that did not help. Some nausea. Pt states she does not have a gallbladder    HPI  Patient Active Problem List   Diagnosis Date Noted   Left lower quadrant abdominal pain 05/13/2022   Slow transit constipation 05/13/2022   Tonsillitis 09/04/2020   Depressive disorder 06/22/2020   Insomnia 06/22/2020   Head trauma 04/17/2020   Memory loss 04/17/2020   Intractable post-traumatic headache 04/17/2020   Sinusitis 10/22/2011   Serous otitis media 07/08/2011   Viral pharyngitis 06/05/2011   HYPERTRIGLYCERIDEMIA 02/19/2010   EPISTAXIS 07/31/2009   UNSPECIFIED ANEMIA 02/13/2009   INGROWN TOENAIL 02/13/2009   OTITIS EXTERNA, ACUTE, BILATERAL 10/06/2008   GERD 10/06/2008   PARONYCHIA, LEFT GREAT TOE 07/27/2008   VIRAL URI 03/08/2008   No past medical history on file. Past Surgical History:  Procedure Laterality Date   CHOLECYSTECTOMY     Social History   Tobacco Use   Smoking status: Some Days    Types: Cigarettes   Smokeless tobacco: Never  Vaping Use   Vaping Use: Some days   Substances: Nicotine, Flavoring  Substance Use Topics   Alcohol use: Yes    Alcohol/week: 6.0 standard drinks of alcohol    Types: 6 Standard drinks or equivalent per week   Drug use: No   Social History   Socioeconomic History   Marital status: Single    Spouse name: Not on file   Number of children: Not on file   Years of education: Not on file   Highest education level: Not on file  Occupational History   Not on file  Tobacco Use   Smoking status: Some Days    Types: Cigarettes   Smokeless tobacco: Never  Vaping Use   Vaping Use:  Some days   Substances: Nicotine, Flavoring  Substance and Sexual Activity   Alcohol use: Yes    Alcohol/week: 6.0 standard drinks of alcohol    Types: 6 Standard drinks or equivalent per week   Drug use: No   Sexual activity: Not on file  Other Topics Concern   Not on file  Social History Narrative   Not on file   Social Determinants of Health   Financial Resource Strain: Not on file  Food Insecurity: Not on file  Transportation Needs: Not on file  Physical Activity: Not on file  Stress: Not on file  Social Connections: Not on file  Intimate Partner Violence: Not on file   Family Status  Relation Name Status   Father  Deceased   Mother  Alive   Family History  Problem Relation Age of Onset   Hypertension Father    Hyperlipidemia Father    Healthy Mother    Allergies  Allergen Reactions   Sulfa Antibiotics Hives   Clindamycin/Lincomycin Nausea And Vomiting   Amoxicillin Nausea And Vomiting   Bactrim [Sulfamethoxazole-Trimethoprim]    Penicillins Nausea And Vomiting   Red Dye Nausea And Vomiting      Review of Systems  Constitutional:  Negative for fever and malaise/fatigue.  HENT:  Negative for congestion.   Eyes:  Negative for blurred vision.  Respiratory:  Negative for shortness of breath.   Cardiovascular:  Negative for chest pain, palpitations and leg swelling.  Gastrointestinal:  Positive for abdominal pain and constipation. Negative for blood in stool, diarrhea, melena, nausea and vomiting.  Genitourinary:  Negative for dysuria and frequency.  Musculoskeletal:  Negative for falls.  Skin:  Negative for rash.  Neurological:  Negative for dizziness, loss of consciousness and headaches.  Endo/Heme/Allergies:  Negative for environmental allergies.  Psychiatric/Behavioral:  Negative for depression. The patient is not nervous/anxious.       Objective:     BP 118/82 (BP Location: Left Arm, Patient Position: Sitting, Cuff Size: Large)   Pulse 87   Temp  98.6 F (37 C) (Oral)   Resp 18   Ht '5\' 8"'$  (1.727 m)   Wt 229 lb (103.9 kg)   SpO2 99%   BMI 34.82 kg/m  BP Readings from Last 3 Encounters:  05/13/22 118/82  03/13/21 107/72  11/22/20 102/60   Wt Readings from Last 3 Encounters:  05/13/22 229 lb (103.9 kg)  03/13/21 220 lb (99.8 kg)  11/22/20 226 lb 6 oz (102.7 kg)    Physical Exam Vitals and nursing note reviewed.  Constitutional:      Appearance: She is well-developed.  HENT:     Head: Normocephalic and atraumatic.  Eyes:     Conjunctiva/sclera: Conjunctivae normal.  Neck:     Thyroid: No thyromegaly.     Vascular: No carotid bruit or JVD.  Cardiovascular:     Rate and Rhythm: Normal rate and regular rhythm.     Heart sounds: Normal heart sounds. No murmur heard. Pulmonary:     Effort: Pulmonary effort is normal. No respiratory distress.     Breath sounds: Normal breath sounds. No wheezing or rales.  Chest:     Chest wall: No tenderness.  Abdominal:     General: There is no distension.     Palpations: Abdomen is soft.     Tenderness: There is no right CVA tenderness, left CVA tenderness, guarding or rebound.  Musculoskeletal:     Cervical back: Normal range of motion and neck supple.  Neurological:     Mental Status: She is alert and oriented to person, place, and time.      Results for orders placed or performed in visit on 05/13/22  CBC with Differential/Platelet  Result Value Ref Range   WBC 6.4 4.0 - 10.5 K/uL   RBC 4.51 3.87 - 5.11 Mil/uL   Hemoglobin 13.3 12.0 - 15.0 g/dL   HCT 39.4 36.0 - 46.0 %   MCV 87.3 78.0 - 100.0 fl   MCHC 33.7 30.0 - 36.0 g/dL   RDW 12.7 11.5 - 15.5 %   Platelets 271.0 150.0 - 400.0 K/uL   Neutrophils Relative % 56.3 43.0 - 77.0 %   Lymphocytes Relative 35.4 12.0 - 46.0 %   Monocytes Relative 7.7 3.0 - 12.0 %   Eosinophils Relative 0.3 0.0 - 5.0 %   Basophils Relative 0.3 0.0 - 3.0 %   Neutro Abs 3.6 1.4 - 7.7 K/uL   Lymphs Abs 2.3 0.7 - 4.0 K/uL   Monocytes  Absolute 0.5 0.1 - 1.0 K/uL   Eosinophils Absolute 0.0 0.0 - 0.7 K/uL   Basophils Absolute 0.0 0.0 - 0.1 K/uL  Comprehensive metabolic panel  Result Value Ref Range   Sodium 138 135 - 145 mEq/L   Potassium 4.4 3.5 - 5.1 mEq/L   Chloride 104 96 - 112 mEq/L   CO2 26 19 -  32 mEq/L   Glucose, Bld 77 70 - 99 mg/dL   BUN 8 6 - 23 mg/dL   Creatinine, Ser 0.79 0.40 - 1.20 mg/dL   Total Bilirubin 0.4 0.2 - 1.2 mg/dL   Alkaline Phosphatase 53 39 - 117 U/L   AST 12 0 - 37 U/L   ALT 12 0 - 35 U/L   Total Protein 7.2 6.0 - 8.3 g/dL   Albumin 4.0 3.5 - 5.2 g/dL   GFR 105.41 >60.00 mL/min   Calcium 10.1 8.4 - 10.5 mg/dL  hCG, quantitative, pregnancy  Result Value Ref Range   Quantitative HCG <0.60 mIU/ml    Last CBC Lab Results  Component Value Date   WBC 6.4 05/13/2022   HGB 13.3 05/13/2022   HCT 39.4 05/13/2022   MCV 87.3 05/13/2022   MCH 27.4 10/22/2011   RDW 12.7 05/13/2022   PLT 271.0 [id number]   Last metabolic panel Lab Results  Component Value Date   GLUCOSE 77 05/13/2022   NA 138 05/13/2022   K 4.4 05/13/2022   CL 104 05/13/2022   CO2 26 05/13/2022   BUN 8 05/13/2022   CREATININE 0.79 05/13/2022   CALCIUM 10.1 05/13/2022   PROT 7.2 05/13/2022   ALBUMIN 4.0 05/13/2022   BILITOT 0.4 05/13/2022   ALKPHOS 53 05/13/2022   AST 12 05/13/2022   ALT 12 05/13/2022   Last lipids Lab Results  Component Value Date   CHOL 176 02/13/2010   HDL 32.60 (L) 02/13/2010   LDLDIRECT 112.0 02/13/2010   TRIG 258.0 (H) 02/13/2010   CHOLHDL 5 02/13/2010   Last hemoglobin A1c No results found for: "HGBA1C" Last thyroid functions No results found for: "TSH", "T3TOTAL", "T4TOTAL", "THYROIDAB" Last vitamin D No results found for: "25OHVITD2", "25OHVITD3", "VD25OH" Last vitamin B12 and Folate No results found for: "VITAMINB12", "FOLATE"    The ASCVD Risk score (Arnett DK, et al., 2019) failed to calculate for the following reasons:   The 2019 ASCVD risk score is only valid for  ages 58 to 60    Assessment & Plan:   Problem List Items Addressed This Visit       Unprioritized   Slow transit constipation    Pt has already been taking daily fiber , miralax and other otc laxatives She took mag citrate last week  with good results but still had episode of LLQ pain  Check xray ---  if LLQ pain returns consider CT       Relevant Orders   DG Abd 2 Views (Completed)   Thyroid Panel With TSH   CBC with Differential/Platelet (Completed)   Comprehensive metabolic panel (Completed)   Left lower quadrant abdominal pain - Primary    No pain on exam today      Relevant Orders   Thyroid Panel With TSH   CBC with Differential/Platelet (Completed)   Comprehensive metabolic panel (Completed)   hCG, quantitative, pregnancy (Completed)    No follow-ups on file.    Ann Held, DO

## 2022-05-13 NOTE — Assessment & Plan Note (Signed)
No pain on exam today

## 2022-05-14 LAB — THYROID PANEL WITH TSH
Free Thyroxine Index: 2.6 (ref 1.4–3.8)
T3 Uptake: 21 % — ABNORMAL LOW (ref 22–35)
T4, Total: 12.4 ug/dL — ABNORMAL HIGH (ref 5.1–11.9)
TSH: 2.58 mIU/L

## 2022-06-04 ENCOUNTER — Telehealth: Payer: Self-pay | Admitting: Family Medicine

## 2022-06-04 ENCOUNTER — Other Ambulatory Visit: Payer: Self-pay | Admitting: Family Medicine

## 2022-06-04 DIAGNOSIS — K5901 Slow transit constipation: Secondary | ICD-10-CM

## 2022-06-04 NOTE — Telephone Encounter (Signed)
Please advise 

## 2022-06-04 NOTE — Telephone Encounter (Signed)
Pt states lubiprostone is not helping and she would really like a GI referral. Stated she has been dealing with this for a while.

## 2022-06-05 ENCOUNTER — Other Ambulatory Visit: Payer: Self-pay | Admitting: Family Medicine

## 2022-06-05 DIAGNOSIS — K5901 Slow transit constipation: Secondary | ICD-10-CM

## 2022-06-10 ENCOUNTER — Encounter: Payer: Self-pay | Admitting: Gastroenterology

## 2022-07-23 ENCOUNTER — Ambulatory Visit: Payer: Commercial Managed Care - PPO | Admitting: Gastroenterology

## 2022-07-23 ENCOUNTER — Encounter: Payer: Self-pay | Admitting: Gastroenterology

## 2022-07-23 VITALS — BP 122/72 | HR 86 | Ht 68.0 in | Wt 225.0 lb

## 2022-07-23 DIAGNOSIS — K5909 Other constipation: Secondary | ICD-10-CM | POA: Diagnosis not present

## 2022-07-23 NOTE — Progress Notes (Signed)
07/23/2022 Sherry Dominguez [id number] 09-13-2000   HISTORY OF PRESENT ILLNESS:  This is a 22 year old female who is new to our office and has been referred here by her PCP, Dr. Zola Button, for reports of change in bowel habits/constipation since January.  She reports having her gallbladder out in 2017 and her stools have always been the same since then.  In January she had an expected/unexplained change.  She tried increasing fiber in her diet and tried fiber Gummies with minimal improvement.  Abdominal x-ray showed fecal loading.  Basic labs including TSH and thyroid studies okay.  Tried Amitiza for less than a week, but did not like the way it made her feel.  Has been taking apple cider vinegar Gummies that have helped significantly.  Everything has resolved/is completely back to her normal, but she wanted to be seen to see what the underlying cause of the change was.  No other changes in medications or diet.   Past Medical History:  Diagnosis Date   Gallstones    Past Surgical History:  Procedure Laterality Date   CHOLECYSTECTOMY      reports that she has been smoking cigarettes. She has never used smokeless tobacco. She reports current alcohol use of about 6.0 standard drinks of alcohol per week. She reports that she does not use drugs. family history includes Healthy in her mother; Hyperlipidemia in her father; Hypertension in her father. Allergies  Allergen Reactions   Sulfa Antibiotics Hives   Clindamycin/Lincomycin Nausea And Vomiting   Amoxicillin Nausea And Vomiting   Bactrim [Sulfamethoxazole-Trimethoprim]    Penicillins Nausea And Vomiting   Red Dye Nausea And Vomiting      Outpatient Encounter Medications as of 07/23/2022  Medication Sig   norgestimate-ethinyl estradiol (ORTHO-CYCLEN) 0.25-35 MG-MCG tablet Take 1 tablet by mouth daily.   lubiprostone (AMITIZA) 24 MCG capsule Take 1 capsule (24 mcg total) by mouth 2 (two) times daily with a meal. (Patient not taking:  Reported on 07/23/2022)   No facility-administered encounter medications on file as of 07/23/2022.     REVIEW OF SYSTEMS  : All other systems reviewed and negative except where noted in the History of Present Illness.   PHYSICAL EXAM: BP 122/72   Pulse 86   Ht 5\' 8"  (1.727 m)   Wt 225 lb (102.1 kg)   BMI 34.21 kg/m  General: Well developed white female in no acute distress Head: Normocephalic and atraumatic Eyes:  Sclerae anicteric, conjunctiva pink. Ears: Normal auditory acuity Lungs: Clear throughout to auscultation; no W/R/R. Heart: Regular rate and rhythm; no M/R/G. Abdomen: Soft, non-distended.  BS present.  Non-tender. Musculoskeletal: Symmetrical with no gross deformities  Skin: No lesions on visible extremities Extremities: No edema  Neurological: Alert oriented x 4, grossly non-focal Psychological:  Alert and cooperative. Normal mood and affect  ASSESSMENT AND PLAN: *22 year old female with reports of change in bowel habits/constipation since January.  She reports having her gallbladder out in 2017 and her stools have always been the same since then.  In January she had an expected/unexplained change.  Abdominal x-ray showed fecal loading.  Basic labs including TSH and thyroid studies okay.  Tried Amitiza for less than a week, but did not like the way it made her feel.  Has been taking apple cider vinegar Gummies that have helped significantly.  Everything has resolved, but she wanted to be seen to see what the underlying cause of the change was.  No other changes in medications or diet.  Advised that most the time it is just motility issue, idiopathic with no definite source found.  We discussed very unlikely an anatomic issue and colonoscopy could be considered, but she declined for now.  Low suspicion by history for any other underlying anatomic/pathologic issues.  If symptoms were to recur she will let us know and colonoscopy will be considered.  Otherwise we will continue  the apple cider vinegar Gummies.   CC:  Donato Schultz, *

## 2022-07-23 NOTE — Patient Instructions (Signed)
Follow up as needed  _______________________________________________________  If your blood pressure at your visit was 140/90 or greater, please contact your primary care physician to follow up on this.  _______________________________________________________  If you are age 24 or older, your body mass index should be between 23-30. Your Body mass index is 34.21 kg/m. If this is out of the aforementioned range listed, please consider follow up with your Primary Care Provider.  If you are age 33 or younger, your body mass index should be between 19-25. Your Body mass index is 34.21 kg/m. If this is out of the aformentioned range listed, please consider follow up with your Primary Care Provider.   ________________________________________________________  The  GI providers would like to encourage you to use [facility] to communicate with providers for non-urgent requests or questions.  Due to long hold times on the telephone, sending your provider a message by [facility] may be a faster and more efficient way to get a response.  Please allow 48 business hours for a response.  Please remember that this is for non-urgent requests.  _______________________________________________________   I appreciate the  opportunity to care for you  Thank You   Shanda Bumps Zehr,PA-C

## 2022-07-25 NOTE — Progress Notes (Signed)
I agree with the assessment and plan as outlined by Ms. Zehr. 

## 2022-08-06 ENCOUNTER — Ambulatory Visit
Admission: RE | Admit: 2022-08-06 | Discharge: 2022-08-06 | Disposition: A | Discharge status: Home / Self Care | Payer: Commercial Managed Care - PPO | Source: Ambulatory Visit | Attending: Family Medicine | Admitting: Family Medicine

## 2022-08-06 VITALS — BP 121/87 | HR 95 | Temp 98.4°F | Resp 16

## 2022-08-06 DIAGNOSIS — J029 Acute pharyngitis, unspecified: Secondary | ICD-10-CM | POA: Diagnosis not present

## 2022-08-06 LAB — POCT RAPID STREP A (OFFICE): Rapid Strep A Screen: NEGATIVE

## 2022-08-06 MED ORDER — AZITHROMYCIN 250 MG PO TABS: 250.0000 mg | ORAL_TABLET | Freq: Every day | ORAL | 0 refills | Status: AC

## 2022-08-06 NOTE — Discharge Instructions (Addendum)
Advised patient rapid strep throat was negative, throat culture ordered.  May take OTC Ibuprofen 800 mg daily or as needed for sore throat pain. Advised if throat culture returns positive or symptoms worsen may start Zithromax.  Advised if taking this medication take with food to completion.  Encouraged increase daily water intake to 64 ounces per day when taking these medications.  Advised if symptoms worsen and/or unresolved please follow-up with PCP or here for further evaluation.

## 2022-08-06 NOTE — ED Triage Notes (Signed)
Pt presents to [facility] with co of ha, sore throat, body aches, facial congestion since yesterday morning. Pt reports she has been using motrin for pain and tylenol pm.

## 2022-08-06 NOTE — ED Provider Notes (Signed)
[facility]    CSN: [id number] Arrival date & time: 08/06/22  1222      History   Chief Complaint Chief Complaint  Patient presents with   Nasal Congestion    Sore throat, body aches, hot flashes, headache - Entered by patient    HPI Sherry Dominguez is a 22 y.o. female.   HPI 22 year old female presents with sore throat, body aches, hot flashes, and headache since yesterday morning.  Reports sore throat has has worsened over the past 4 to 5 days.  Reports using Motrin and Tylenol for pain.  PMH significant for obesity, head trauma, and hypertriglyceridemia.  Past Medical History:  Diagnosis Date   Gallstones     Patient Active Problem List   Diagnosis Date Noted   Left lower quadrant abdominal pain 05/13/2022   Other constipation 05/13/2022   Tonsillitis 09/04/2020   Depressive disorder 06/22/2020   Insomnia 06/22/2020   Head trauma 04/17/2020   Memory loss 04/17/2020   Intractable post-traumatic headache 04/17/2020   Sinusitis 10/22/2011   Serous otitis media 07/08/2011   Viral pharyngitis 06/05/2011   HYPERTRIGLYCERIDEMIA 02/19/2010   EPISTAXIS 07/31/2009   UNSPECIFIED ANEMIA 02/13/2009   INGROWN TOENAIL 02/13/2009   OTITIS EXTERNA, ACUTE, BILATERAL 10/06/2008   GERD 10/06/2008   PARONYCHIA, LEFT GREAT TOE 07/27/2008   VIRAL URI 03/08/2008    Past Surgical History:  Procedure Laterality Date   CHOLECYSTECTOMY      OB History   No obstetric history on file.      Home Medications    Prior to Admission medications   Medication Sig Start Date End Date Taking? Authorizing Provider  azithromycin (ZITHROMAX) 250 MG tablet Take 1 tablet (250 mg total) by mouth daily. Take first 2 tablets together, then 1 every day until finished. 08/06/22  Yes Trevor Iha, FNP  lubiprostone (AMITIZA) 24 MCG capsule Take 1 capsule (24 mcg total) by mouth 2 (two) times daily with a meal. Patient not taking: Reported on 07/23/2022 05/13/22   Donato Schultz,  DO  norgestimate-ethinyl estradiol (ORTHO-CYCLEN) 0.25-35 MG-MCG tablet Take 1 tablet by mouth daily.    [provider]    Family History Family History  Problem Relation Age of Onset   Hypertension Father    Hyperlipidemia Father    Healthy Mother     Social History Social History   Tobacco Use   Smoking status: Some Days   Smokeless tobacco: Never  Vaping Use   Vaping Use: Some days   Substances: Nicotine, Flavoring  Substance Use Topics   Alcohol use: Yes    Alcohol/week: 3.0 standard drinks of alcohol    Types: 3 Standard drinks or equivalent per week   Drug use: Yes    Types: Marijuana    Comment: sometimes     Allergies   Sulfa antibiotics, Clindamycin/lincomycin, Amoxicillin, Bactrim [sulfamethoxazole-trimethoprim], Penicillins, and Red dye   Review of Systems Review of Systems  HENT:  Positive for congestion and sore throat.   Musculoskeletal:  Positive for arthralgias.     Physical Exam Triage Vital Signs ED Triage Vitals  Enc Vitals Group     BP 08/06/22 1253 121/87     Pulse Rate 08/06/22 1253 95     Resp 08/06/22 1253 16     Temp 08/06/22 1253 98.4 F (36.9 C)     Temp src --      SpO2 08/06/22 1253 98 %     Weight --      Height --  Head Circumference --      Peak Flow --      Pain Score 08/06/22 1251 0     Pain Loc --      Pain Edu? --      Excl. in GC? --    No data found.  Updated Vital Signs BP 121/87   Pulse 95   Temp 98.4 F (36.9 C)   Resp 16   LMP 07/04/2022 (Approximate)   SpO2 98%       Physical Exam Vitals and nursing note reviewed.  Constitutional:      Appearance: Normal appearance. She is obese.  HENT:     Head: Normocephalic and atraumatic.     Right Ear: Tympanic membrane, ear canal and external ear normal.     Left Ear: Tympanic membrane, ear canal and external ear normal.     Mouth/Throat:     Mouth: Mucous membranes are moist.     Pharynx: Oropharynx is clear. Uvula midline. Posterior  oropharyngeal erythema present. No pharyngeal swelling, oropharyngeal exudate or uvula swelling.     Tonsils: No tonsillar exudate.  Eyes:     Extraocular Movements: Extraocular movements intact.     Conjunctiva/sclera: Conjunctivae normal.     Pupils: Pupils are equal, round, and reactive to light.  Cardiovascular:     Rate and Rhythm: Normal rate and regular rhythm.     Pulses: Normal pulses.     Heart sounds: Normal heart sounds.  Pulmonary:     Effort: Pulmonary effort is normal.     Breath sounds: Normal breath sounds. No wheezing, rhonchi or rales.  Musculoskeletal:        General: Normal range of motion.     Cervical back: Normal range of motion and neck supple.  Skin:    General: Skin is warm and dry.  Neurological:     General: No focal deficit present.     Mental Status: She is alert and oriented to person, place, and time. Mental status is at baseline.  Psychiatric:        Mood and Affect: Mood normal.        Behavior: Behavior normal.        Thought Content: Thought content normal.      UC Treatments / Results  Labs (all labs ordered are listed, but only abnormal results are displayed) Labs Reviewed  CULTURE, GROUP A STREP [facility])  POCT RAPID STREP A (OFFICE)    EKG   Radiology No results found.  Procedures Procedures (including critical care time)  Medications Ordered in UC Medications - No data to display  Initial Impression / Assessment and Plan / UC Course  I have reviewed the triage vital signs and the nursing notes.  Pertinent labs & imaging results that were available during my care of the patient were reviewed by me and considered in my medical decision making (see chart for details).     MDM: 1.  Acute pharyngitis, unspecified etiology-rapid strep negative, throat culture ordered Rx'd Zithromax (500 mg day 1, 250 mg daily x 4 days) patient advised to hold this medication until throat culture results return and/or symptoms worsen. Advised  patient rapid strep throat was negative, throat culture ordered.  May take OTC Ibuprofen 800 mg daily or as needed for sore throat pain. Advised if throat culture returns positive or symptoms worsen may start Zithromax.  Advised if taking this medication take with food to completion.  Encouraged increase daily water intake to 64 ounces per day  when taking these medications.  Advised if symptoms worsen and/or unresolved please follow-up with PCP or here for further evaluation.  Final Clinical Impressions(s) / UC Diagnoses   Final diagnoses:  Acute pharyngitis, unspecified etiology     Discharge Instructions      Advised patient rapid strep throat was negative, throat culture ordered.  May take OTC Ibuprofen 800 mg daily or as needed for sore throat pain. Advised if throat culture returns positive or symptoms worsen may start Zithromax.  Advised if taking this medication take with food to completion.  Encouraged increase daily water intake to 64 ounces per day when taking these medications.  Advised if symptoms worsen and/or unresolved please follow-up with PCP or here for further evaluation.     ED Prescriptions     Medication Sig Dispense Auth. Provider   azithromycin (ZITHROMAX) 250 MG tablet Take 1 tablet (250 mg total) by mouth daily. Take first 2 tablets together, then 1 every day until finished. 6 tablet Trevor Iha, FNP      PDMP not reviewed this encounter.   Trevor Iha, FNP 08/06/22 1407

## 2022-08-08 LAB — CULTURE, GROUP A STREP (THRC)

## 2022-08-28 ENCOUNTER — Ambulatory Visit
Admission: RE | Admit: 2022-08-28 | Discharge: 2022-08-28 | Disposition: A | Discharge status: Home / Self Care | Payer: Commercial Managed Care - PPO | Source: Ambulatory Visit | Attending: Nurse Practitioner | Admitting: Nurse Practitioner

## 2022-08-28 VITALS — BP 111/76 | HR 82 | Temp 98.6°F | Resp 16

## 2022-08-28 DIAGNOSIS — J029 Acute pharyngitis, unspecified: Secondary | ICD-10-CM | POA: Diagnosis present

## 2022-08-28 DIAGNOSIS — Z1152 Encounter for screening for COVID-19: Secondary | ICD-10-CM | POA: Insufficient documentation

## 2022-08-28 LAB — POCT RAPID STREP A (OFFICE): Rapid Strep A Screen: NEGATIVE

## 2022-08-28 NOTE — ED Provider Notes (Signed)
[facility]    CSN: [id number] Arrival date & time: 08/28/22  1610      History   Chief Complaint Chief Complaint  Patient presents with   Sore Throat    HPI Sherry Dominguez is a 22 y.o. female  presents for evaluation of URI symptoms for 4 days. Patient reports associated symptoms of throat and ear pain. Denies N/V/D, fevers cough, congestion, body aches, shortness of breath. Patient does not have a hx of asthma.  Is an active smoker.  No known sick contacts.  She has taken a Z-Pak that she was prescribed last month for sore throat that she ended up not taking.  She states this is her third day on a Z-Pak with no improvement in symptoms.  Pt has taken nothing OTC for symptoms. Pt has no other concerns at this time.    Sore Throat    Past Medical History:  Diagnosis Date   Gallstones     Patient Active Problem List   Diagnosis Date Noted   Left lower quadrant abdominal pain 05/13/2022   Other constipation 05/13/2022   Tonsillitis 09/04/2020   Depressive disorder 06/22/2020   Insomnia 06/22/2020   Head trauma 04/17/2020   Memory loss 04/17/2020   Intractable post-traumatic headache 04/17/2020   Sinusitis 10/22/2011   Serous otitis media 07/08/2011   Viral pharyngitis 06/05/2011   HYPERTRIGLYCERIDEMIA 02/19/2010   EPISTAXIS 07/31/2009   UNSPECIFIED ANEMIA 02/13/2009   INGROWN TOENAIL 02/13/2009   OTITIS EXTERNA, ACUTE, BILATERAL 10/06/2008   GERD 10/06/2008   PARONYCHIA, LEFT GREAT TOE 07/27/2008   VIRAL URI 03/08/2008    Past Surgical History:  Procedure Laterality Date   CHOLECYSTECTOMY      OB History   No obstetric history on file.      Home Medications    Prior to Admission medications   Medication Sig Start Date End Date Taking? Authorizing Provider  azithromycin (ZITHROMAX) 250 MG tablet Take 1 tablet (250 mg total) by mouth daily. Take first 2 tablets together, then 1 every day until finished. 08/06/22   Trevor Iha, FNP   lubiprostone (AMITIZA) 24 MCG capsule Take 1 capsule (24 mcg total) by mouth 2 (two) times daily with a meal. Patient not taking: Reported on 07/23/2022 05/13/22   Donato Schultz, DO  norgestimate-ethinyl estradiol (ORTHO-CYCLEN) 0.25-35 MG-MCG tablet Take 1 tablet by mouth daily.    [provider]    Family History Family History  Problem Relation Age of Onset   Hypertension Father    Hyperlipidemia Father    Healthy Mother     Social History Social History   Tobacco Use   Smoking status: Some Days   Smokeless tobacco: Never  Vaping Use   Vaping Use: Some days   Substances: Nicotine, Flavoring  Substance Use Topics   Alcohol use: Yes    Alcohol/week: 3.0 standard drinks of alcohol    Types: 3 Standard drinks or equivalent per week   Drug use: Yes    Types: Marijuana    Comment: sometimes     Allergies   Sulfa antibiotics, Clindamycin/lincomycin, Amoxicillin, Bactrim [sulfamethoxazole-trimethoprim], Penicillins, and Red dye   Review of Systems Review of Systems  HENT:  Positive for ear pain and sore throat.      Physical Exam Triage Vital Signs ED Triage Vitals  Enc Vitals Group     BP 08/28/22 1620 111/76     Pulse Rate 08/28/22 1620 82     Resp 08/28/22 1620 16  Temp 08/28/22 1620 98.6 F (37 C)     Temp Source 08/28/22 1620 Oral     SpO2 08/28/22 1620 97 %     Weight --      Height --      Head Circumference --      Peak Flow --      Pain Score 08/28/22 1623 5     Pain Loc --      Pain Edu? --      Excl. in GC? --    No data found.  Updated Vital Signs BP 111/76 (BP Location: Right Arm)   Pulse 82   Temp 98.6 F (37 C) (Oral)   Resp 16   LMP 07/04/2022 (Approximate)   SpO2 97%   Visual Acuity Right Eye Distance:   Left Eye Distance:   Bilateral Distance:    Right Eye Near:   Left Eye Near:    Bilateral Near:     Physical Exam Vitals and nursing note reviewed.  Constitutional:      General: She is not in acute  distress.    Appearance: She is well-developed. She is not ill-appearing.  HENT:     Head: Normocephalic and atraumatic.     Right Ear: Tympanic membrane and ear canal normal.     Left Ear: Tympanic membrane and ear canal normal.     Nose: No congestion.     Mouth/Throat:     Mouth: Mucous membranes are moist.     Pharynx: Oropharynx is clear. Uvula midline. Posterior oropharyngeal erythema present.     Tonsils: No tonsillar exudate or tonsillar abscesses.  Eyes:     Conjunctiva/sclera: Conjunctivae normal.     Pupils: Pupils are equal, round, and reactive to light.  Cardiovascular:     Rate and Rhythm: Normal rate and regular rhythm.     Heart sounds: Normal heart sounds.  Pulmonary:     Effort: Pulmonary effort is normal.     Breath sounds: Normal breath sounds.  Musculoskeletal:     Cervical back: Normal range of motion and neck supple.  Lymphadenopathy:     Cervical: No cervical adenopathy.  Skin:    General: Skin is warm and dry.  Neurological:     General: No focal deficit present.     Mental Status: She is alert and oriented to person, place, and time.  Psychiatric:        Mood and Affect: Mood normal.        Behavior: Behavior normal.      UC Treatments / Results  Labs (all labs ordered are listed, but only abnormal results are displayed) Labs Reviewed  SARS CORONAVIRUS 2 (TAT 6-24 HRS)  POCT RAPID STREP A (OFFICE)    EKG   Radiology No results found.  Procedures Procedures (including critical care time)  Medications Ordered in UC Medications - No data to display  Initial Impression / Assessment and Plan / UC Course  I have reviewed the triage vital signs and the nursing notes.  Pertinent labs & imaging results that were available during my care of the patient were reviewed by me and considered in my medical decision making (see chart for details).     Negative rapid strep.  COVID PCR and will contact if positive.  As patient is already on  Zithromax do not feel we need to culture as this will cover strep.  Did discuss possible viral/allergic causes of symptoms.  Discussed symptomatic treatment while awaiting COVID test results with  water gargles and warm liquids.  PCP follow-up as symptoms do not improve ER precautions reviewed and patient verbalized understanding Final Clinical Impressions(s) / UC Diagnoses   Final diagnoses:  Sore throat  Acute pharyngitis, unspecified etiology     Discharge Instructions      Complete your Z-Pak as prescribed Salt water gargles and warm liquids such as teas and honey The clinic will contact you with results your COVID test is positive Follow-up with your PCP if your symptoms do not improve Please go to the ER for any worsening symptoms Hope you feel better soon!     ED Prescriptions   None    PDMP not reviewed this encounter.   Radford Pax, NP 08/28/22 1723

## 2022-08-28 NOTE — Discharge Instructions (Signed)
Complete your Z-Pak as prescribed Salt water gargles and warm liquids such as teas and honey The clinic will contact you with results your COVID test is positive Follow-up with your PCP if your symptoms do not improve Please go to the ER for any worsening symptoms Hope you feel better soon!

## 2022-08-28 NOTE — ED Triage Notes (Signed)
Pt presents to UC w/ c/o throat pain x4 days when swalling. Pain now radiates to bilat ears. No cough, no runny nose. Pt was seen at another UC 3 weeks ago for same thing. Was prescribed azithromycin but did not start it until 2 days ago because "my throat felt better at first." Pt states she is able to drink fluids and soft foods.

## 2022-08-29 LAB — SARS CORONAVIRUS 2 (TAT 6-24 HRS): SARS Coronavirus 2: NEGATIVE

## 2022-09-09 ENCOUNTER — Telehealth: Payer: Self-pay

## 2022-09-09 NOTE — Transitions of Care (Post Inpatient/ED Visit) (Signed)
   09/09/2022  Name: Sherry Dominguez MRN: [id number] DOB: June 02, 2000  Today's TOC FU Call Status: Today's TOC FU Call Status:: Successful TOC FU Call Competed TOC FU Call Complete Date: 09/09/22  Transition Care Management Follow-up Telephone Call Date of Discharge: 09/08/22 Discharge Facility: [facility] Type of Discharge: Inpatient Admission Primary Inpatient Discharge Diagnosis:: dehydration How have you been since you were released from the hospital?: Better Any questions or concerns?: No  Items Reviewed: Did you receive and understand the discharge instructions provided?: No Medications obtained,verified, and reconciled?: Yes (Medications Reviewed) Any new allergies since your discharge?: No Dietary orders reviewed?: Yes Do you have support at home?: Yes People in Home: parent(s)  Medications Reviewed Today: Medications Reviewed Today     Reviewed by Venida Jarvis, RN (Registered Nurse) on 08/28/22 at 1620  Med List Status: <None>   Medication Order Taking? Sig Documenting Provider Last Dose Status Informant  azithromycin (ZITHROMAX) 250 MG tablet [id number]  Take 1 tablet (250 mg total) by mouth daily. Take first 2 tablets together, then 1 every day until finished. Trevor Iha, FNP  Active   lubiprostone (AMITIZA) 24 MCG capsule [id number]  Take 1 capsule (24 mcg total) by mouth 2 (two) times daily with a meal.  Patient not taking: Reported on 07/23/2022   Donato Schultz, DO  Active   norgestimate-ethinyl estradiol (ORTHO-CYCLEN) 0.25-35 MG-MCG tablet [id number]  Take 1 tablet by mouth daily. [provider]  Active             Home Care and Equipment/Supplies: Were Home Health Services Ordered?: NA Any new equipment or medical supplies ordered?: NA  Functional Questionnaire: Do you need assistance with bathing/showering or dressing?: No Do you need assistance with meal preparation?: No Do you need assistance with eating?: No Do you  have difficulty maintaining continence: No Do you need assistance with getting out of bed/getting out of a chair/moving?: No Do you have difficulty managing or taking your medications?: No  Follow up appointments reviewed: PCP Follow-up appointment confirmed?: NA Specialist Hospital Follow-up appointment confirmed?: No Reason Specialist Follow-Up Not Confirmed: Patient has Specialist Provider Number and will Call for Appointment Do you need transportation to your follow-up appointment?: No Do you understand care options if your condition(s) worsen?: Yes-patient verbalized understanding    SIGNATURE Karena Addison, LPN [facility] Nurse Health Advisor Direct Dial [phone]
